# Patient Record
Sex: Female | Born: 2000 | Hispanic: Yes | Marital: Single | Smoking: Never smoker
Health system: Southern US, Community
[De-identification: ages and names within clinical notes are randomized; demographics above are authoritative.]

## PROBLEM LIST (undated history)

## (undated) DIAGNOSIS — E785 Hyperlipidemia, unspecified: Secondary | ICD-10-CM

## (undated) HISTORY — DX: Hyperlipidemia, unspecified: E78.5

## (undated) HISTORY — PX: POLYDACTYLY RECONSTRUCTION: SHX439

---
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
## 2021-10-09 ENCOUNTER — Ambulatory Visit (INDEPENDENT_AMBULATORY_CARE_PROVIDER_SITE_OTHER): Payer: Medicaid Other | Admitting: Student

## 2021-10-09 ENCOUNTER — Encounter: Payer: Self-pay | Admitting: Student

## 2021-10-09 ENCOUNTER — Other Ambulatory Visit (HOSPITAL_COMMUNITY)
Admission: RE | Admit: 2021-10-09 | Discharge: 2021-10-09 | Disposition: A | Discharge status: Home / Self Care | Payer: Medicaid Other | Source: Ambulatory Visit | Attending: Student | Admitting: Student

## 2021-10-09 VITALS — BP 111/71 | HR 68 | Ht 60.0 in | Wt 131.4 lb

## 2021-10-09 DIAGNOSIS — N898 Other specified noninflammatory disorders of vagina: Secondary | ICD-10-CM

## 2021-10-09 DIAGNOSIS — Z01419 Encounter for gynecological examination (general) (routine) without abnormal findings: Secondary | ICD-10-CM

## 2021-10-09 LAB — POCT URINE PREGNANCY: Preg Test, Ur: NEGATIVE

## 2021-10-09 NOTE — Progress Notes (Signed)
Annual  Stopped OCPs due to "hormone imbalance", mood swings. Sexually active, no contraception Reports reoccurring left side labial "cyst" Reports vaginal bleeding with sex on one occasion STI Testing requested.

## 2021-10-09 NOTE — Progress Notes (Signed)
  History:  Ms. Gabriela Jenkins is a 21 y.o. G0P0000 who presents to clinic today for annual.    For months- year painful cyst on left side.   Mood swings with OCP, stopped taking 6 months.   Clear discharge, less than a month ago.   The following portions of the patient's history were reviewed and updated as appropriate: allergies, current medications, family history, past medical history, social history, past surgical history and problem list.  Review of Systems:  ROS    Objective:  Physical Exam BP 111/71   Pulse 68   Ht 5' (1.524 m)   Wt 131 lb 6.4 oz (59.6 kg)   BMI 25.66 kg/m  Physical Exam    Labs and Imaging No results found for this or any previous visit (from the past 24 hour(s)).  No results found.   Assessment & Plan:  1. Women's annual routine gynecological examination ***    Approximately *** minutes of face-to-face time was spent with this patient   Corlis Hove, NP 10/09/2021 10:11 AM

## 2021-10-10 LAB — CERVICOVAGINAL ANCILLARY ONLY
Bacterial Vaginitis (gardnerella): NEGATIVE
Candida Glabrata: NEGATIVE
Candida Vaginitis: NEGATIVE
Chlamydia: NEGATIVE
Comment: NEGATIVE
Comment: NEGATIVE
Comment: NEGATIVE
Comment: NEGATIVE
Comment: NEGATIVE
Comment: NORMAL
Neisseria Gonorrhea: NEGATIVE
Trichomonas: NEGATIVE

## 2021-10-10 LAB — RPR: RPR Ser Ql: NONREACTIVE

## 2021-10-10 LAB — HIV ANTIBODY (ROUTINE TESTING W REFLEX): HIV Screen 4th Generation wRfx: NONREACTIVE

## 2021-10-10 LAB — HEPATITIS B SURFACE ANTIGEN: Hepatitis B Surface Ag: NEGATIVE

## 2021-10-10 LAB — HEPATITIS C ANTIBODY: Hep C Virus Ab: NONREACTIVE

## 2021-10-10 MED ORDER — LO LOESTRIN FE 1 MG-10 MCG / 10 MCG PO TABS: 1.0000 | ORAL_TABLET | Freq: Every day | ORAL | 3 refills | Status: DC

## 2021-11-26 ENCOUNTER — Ambulatory Visit (HOSPITAL_BASED_OUTPATIENT_CLINIC_OR_DEPARTMENT_OTHER): Payer: Medicaid Other | Admitting: Nurse Practitioner

## 2022-01-03 ENCOUNTER — Other Ambulatory Visit (HOSPITAL_BASED_OUTPATIENT_CLINIC_OR_DEPARTMENT_OTHER): Payer: Self-pay | Admitting: Nurse Practitioner

## 2022-01-03 ENCOUNTER — Ambulatory Visit (INDEPENDENT_AMBULATORY_CARE_PROVIDER_SITE_OTHER): Payer: Medicaid Other | Admitting: Nurse Practitioner

## 2022-01-03 ENCOUNTER — Encounter (HOSPITAL_BASED_OUTPATIENT_CLINIC_OR_DEPARTMENT_OTHER): Payer: Self-pay | Admitting: Nurse Practitioner

## 2022-01-03 VITALS — BP 114/72 | HR 76 | Ht 60.0 in | Wt 132.5 lb

## 2022-01-03 DIAGNOSIS — F339 Major depressive disorder, recurrent, unspecified: Secondary | ICD-10-CM | POA: Diagnosis not present

## 2022-01-03 DIAGNOSIS — Z0000 Encounter for general adult medical examination without abnormal findings: Secondary | ICD-10-CM

## 2022-01-03 DIAGNOSIS — R41840 Attention and concentration deficit: Secondary | ICD-10-CM

## 2022-01-03 DIAGNOSIS — G8929 Other chronic pain: Secondary | ICD-10-CM

## 2022-01-03 DIAGNOSIS — M545 Low back pain, unspecified: Secondary | ICD-10-CM

## 2022-01-03 DIAGNOSIS — F411 Generalized anxiety disorder: Secondary | ICD-10-CM

## 2022-01-03 MED ORDER — METAXALONE 800 MG PO TABS: ORAL_TABLET | ORAL | 3 refills | Status: DC

## 2022-01-03 MED ORDER — BUPROPION HCL ER (SR) 150 MG PO TB12: 150.0000 mg | ORAL_TABLET | Freq: Two times a day (BID) | ORAL | 1 refills | Status: DC

## 2022-01-03 NOTE — Progress Notes (Addendum)
Orma Render, DNP, AGNP-c Primary Care & Sports Medicine [address]  [place], [place] [address] [phone]  New patient visit   Patient: Gabriela Jenkins   DOB: 2000-07-14   21 y.o. Female  MRN: [id number] Visit Date: 01/03/2022  Patient Care Team: Orma Render, NP as PCP - General (Nurse Practitioner)  Today's Vitals   01/03/22 0933  BP: 114/72  Pulse: 76  SpO2: 100%  Weight: 132 lb 8 oz (60.1 kg)  Height: 5' (1.524 m)   Body mass index is 25.88 kg/m.   Today's healthcare provider: Orma Render, NP   Chief Complaint  Patient presents with   New Patient (Initial Visit)    Pt presents today to establish care.    Subjective    Gabriela Jenkins is a 21 y.o. female who presents today as a new patient to establish care.    Patient endorses the following concerns presently:  History of Pre-diabetes. She would like to have this tested today.   Depression She tells me that she has been struggling with depressive symptoms since July pretty severely, but initial onset was at 21 years old. She is seeing a therapist and they have discussed medication. She is interested inthis option.  She tells me that she is concerned that she may have ADHD. She tells me that her mind is spiraling and she is unable to sit still. She tells me that she procrastinates on puts off starting hard projects. She tells me that she has noticed a change in her grades. She has always been a straight A student, but her grades have faltered.   Hands swollen and very hard to move in the mornings. Back pain in the mornings.   Grandfather has RA.  History reviewed and reveals the following: Past Medical History:  Diagnosis Date   Hyperlipidemia    Past Surgical History:  Procedure Laterality Date   POLYDACTYLY RECONSTRUCTION     Family Status  Relation Name Status   Father  (Not Specified)   MGM  (Not Specified)   MGF  (Not Specified)   Mat Uncle  (Not  Specified)   Family History  Problem Relation Age of Onset   Asthma Father    Hypertension Maternal Grandmother    Hyperlipidemia Maternal Grandfather    Heart disease Maternal Uncle    Social History   Socioeconomic History   Marital status: Single    Spouse name: Not on file   Number of children: Not on file   Years of education: Not on file   Highest education level: 11th grade  Occupational History   Not on file  Tobacco Use   Smoking status: Never   Smokeless tobacco: Never  Vaping Use   Vaping Use: Never used  Substance and Sexual Activity   Alcohol use: Never   Drug use: Never   Sexual activity: Yes    Birth control/protection: None  Other Topics Concern   Not on file  Social History Narrative   Not on file   Social Determinants of Health   Financial Resource Strain: Not on file  Food Insecurity: Not on file  Transportation Needs: Not on file  Physical Activity: Not on file  Stress: Not on file  Social Connections: Not on file   Outpatient Medications Prior to Visit  Medication Sig   acetaminophen (TYLENOL) 325 MG tablet Take 650 mg by mouth every 6 (six) hours as needed for mild pain.   Calcium  Carbonate (CALCIUM 500 PO) Take by mouth. (Patient not taking: Reported on 09/03/2020)   LO LOESTRIN FE 1 MG-10 MCG / 10 MCG tablet Take 1 tablet by mouth daily. (Patient not taking: Reported on 01/03/2022)   Multiple Vitamin (MULTIVITAMIN) tablet Take 1 tablet by mouth daily. (Patient not taking: Reported on 10/09/2021)   VITAMIN D PO Take by mouth. (Patient not taking: Reported on 10/09/2021)   No facility-administered medications prior to visit.   Allergies  Allergen Reactions   Pollen Extract     There is no immunization history on file for this patient.  Review of Systems All review of systems negative except what is listed in the HPI   Objective    BP 114/72   Pulse 76   Ht 5' (1.524 m)   Wt 132 lb 8 oz (60.1 kg)   SpO2 100%   BMI 25.88 kg/m   Physical Exam Vitals and nursing note reviewed.  Constitutional:      General: She is not in acute distress.    Appearance: Normal appearance.  Eyes:     Extraocular Movements: Extraocular movements intact.     Conjunctiva/sclera: Conjunctivae normal.     Pupils: Pupils are equal, round, and reactive to light.  Neck:     Vascular: No carotid bruit.  Cardiovascular:     Rate and Rhythm: Normal rate and regular rhythm.     Pulses: Normal pulses.     Heart sounds: Normal heart sounds. No murmur heard. Pulmonary:     Effort: Pulmonary effort is normal.     Breath sounds: Normal breath sounds. No wheezing.  Abdominal:     General: Bowel sounds are normal.     Palpations: Abdomen is soft.  Musculoskeletal:        General: Normal range of motion.     Cervical back: Normal range of motion.     Right lower leg: No edema.     Left lower leg: No edema.  Skin:    General: Skin is warm and dry.     Capillary Refill: Capillary refill takes less than 2 seconds.  Neurological:     General: No focal deficit present.     Mental Status: She is alert and oriented to person, place, and time.  Psychiatric:        Mood and Affect: Mood normal.        Behavior: Behavior normal.        Thought Content: Thought content normal.        Judgment: Judgment normal.     Results for orders placed or performed in visit on 01/03/22  B12 and Folate Panel  Result Value Ref Range   Vitamin B-12 442 232 - 1,245 pg/mL   Folate 12.5 >3.0 ng/mL  CBC With Diff/Platelet  Result Value Ref Range   WBC 7.2 3.4 - 10.8 x10E3/uL   RBC 4.70 3.77 - 5.28 x10E6/uL   Hemoglobin 13.4 11.1 - 15.9 g/dL   Hematocrit 40.8 34.0 - 46.6 %   MCV 87 79 - 97 fL   MCH 28.5 26.6 - 33.0 pg   MCHC 32.8 31.5 - 35.7 g/dL   RDW 13.2 11.7 - 15.4 %   Platelets 248 150 - 450 x10E3/uL   Neutrophils 56 Not Estab. %   Lymphs 32 Not Estab. %   Monocytes 8 Not Estab. %   Eos 3 Not Estab. %   Basos 1 Not Estab. %   Neutrophils  Absolute 4.1 1.4 - 7.0 x10E3/uL  Lymphocytes Absolute 2.3 0.7 - 3.1 x10E3/uL   Monocytes Absolute 0.6 0.1 - 0.9 x10E3/uL   EOS (ABSOLUTE) 0.2 0.0 - 0.4 x10E3/uL   Basophils Absolute 0.1 0.0 - 0.2 x10E3/uL   Immature Granulocytes 0 Not Estab. %   Immature Grans (Abs) 0.0 0.0 - 0.1 x10E3/uL  Comprehensive metabolic panel  Result Value Ref Range   Glucose 94 70 - 99 mg/dL   BUN 14 6 - 20 mg/dL   Creatinine, Ser 0.69 0.57 - 1.00 mg/dL   eGFR 127 >59 mL/min/1.73   BUN/Creatinine Ratio 20 9 - 23   Sodium 139 134 - 144 mmol/L   Potassium 4.4 3.5 - 5.2 mmol/L   Chloride 103 96 - 106 mmol/L   CO2 22 20 - 29 mmol/L   Calcium 9.4 8.7 - 10.2 mg/dL   Total Protein 7.2 6.0 - 8.5 g/dL   Albumin 4.6 4.0 - 5.0 g/dL   Globulin, Total 2.6 1.5 - 4.5 g/dL   Albumin/Globulin Ratio 1.8 1.2 - 2.2   Bilirubin Total 0.3 0.0 - 1.2 mg/dL   Alkaline Phosphatase 74 42 - 106 IU/L   AST 19 0 - 40 IU/L   ALT 25 0 - 32 IU/L  Hemoglobin A1c  Result Value Ref Range   Hgb A1c MFr Bld 5.8 (H) 4.8 - 5.6 %   Est. average glucose Bld gHb Est-mCnc 120 mg/dL  VITAMIN D 25 Hydroxy (Vit-D Deficiency, Fractures)  Result Value Ref Range   Vit D, 25-Hydroxy 21.4 (L) 30.0 - 100.0 ng/mL  Thyroid Panel With TSH  Result Value Ref Range   TSH 1.890 0.450 - 4.500 uIU/mL   T4, Total 5.8 4.5 - 12.0 ug/dL   T3 Uptake Ratio 29 24 - 39 %   Free Thyroxine Index 1.7 1.2 - 4.9    Assessment & Plan      Problem List Items Addressed This Visit     Encounter for medical examination to establish care - Primary    Review of current and past medical history, social history, medication, and family history.  Review of care gaps and health maintenance recommendations.  Records from recent providers to be requested if not available in Chart Review or Care Everywhere.  Recommendations for health maintenance, diet, and exercise provided.        Relevant Orders   B12 and Folate Panel (Completed)   CBC With Diff/Platelet (Completed)    Comprehensive metabolic panel (Completed)   Hemoglobin A1c (Completed)   VITAMIN D 25 Hydroxy (Vit-D Deficiency, Fractures) (Completed)   Thyroid Panel With TSH (Completed)   Depression, recurrent (HCC)    Chronic.  At this time symptoms are worsening and recommendation of her therapist was to seek care for evaluation for medication.  Discussion on medication options with patient today for both treatment of depression symptoms and ADHD include recommendation for Wellbutrin.  Patient is interested in trialing this medication to see if this is helpful for her symptoms.  We will plan to start medication and monitor closely.  Plan to follow-up in 4 weeks or sooner if needed.      Relevant Medications   buPROPion (WELLBUTRIN SR) 150 MG 12 hr tablet   Other Relevant Orders   B12 and Folate Panel (Completed)   CBC With Diff/Platelet (Completed)   Comprehensive metabolic panel (Completed)   Hemoglobin A1c (Completed)   VITAMIN D 25 Hydroxy (Vit-D Deficiency, Fractures) (Completed)   Thyroid Panel With TSH (Completed)   Ambulatory referral to Psychiatry   Generalized anxiety  disorder   Relevant Medications   buPROPion (WELLBUTRIN SR) 150 MG 12 hr tablet   Other Relevant Orders   B12 and Folate Panel (Completed)   CBC With Diff/Platelet (Completed)   Comprehensive metabolic panel (Completed)   Hemoglobin A1c (Completed)   VITAMIN D 25 Hydroxy (Vit-D Deficiency, Fractures) (Completed)   Thyroid Panel With TSH (Completed)   Ambulatory referral to Psychiatry   Attention and concentration deficit    Concerns with attention deficit disorder.  She has not been diagnosed with this formally.  We will send referral to psychiatry for evaluation and diagnosis.  At this time we will plan to start Wellbutrin to see if this is helpful for symptom management for both ADHD and depression.      Relevant Medications   buPROPion (WELLBUTRIN SR) 150 MG 12 hr tablet   Other Relevant Orders   B12 and Folate  Panel (Completed)   CBC With Diff/Platelet (Completed)   Comprehensive metabolic panel (Completed)   Hemoglobin A1c (Completed)   VITAMIN D 25 Hydroxy (Vit-D Deficiency, Fractures) (Completed)   Thyroid Panel With TSH (Completed)   Ambulatory referral to Psychiatry   Chronic midline low back pain without sciatica    Chronic, intermittent.  We will send Skelaxin for as needed use for pain management.  Recommend gentle stretching exercises, ice, heat, and avoidance of limited activity as this can make chronic pain worse.      Relevant Medications   buPROPion (WELLBUTRIN SR) 150 MG 12 hr tablet   metaxalone (SKELAXIN) 800 MG tablet     Return in about 3 weeks (around 01/24/2022) for Phone call- mood.      Briston Lax, Coralee Pesa, NP, DNP, AGNP-C Primary Care & Sports Medicine at [place] Maintenance Due Health Maintenance Topics with due status: Overdue     Topic Date Due   HPV VACCINES Never done   TETANUS/TDAP Never done   INFLUENZA VACCINE Never done

## 2022-01-03 NOTE — Patient Instructions (Addendum)
Thank you for choosing [place] at [facility] for your Primary Care needs. I am excited for the opportunity to partner with you to meet your health care goals. It was a pleasure meeting you today!  Recommendations from today's visit: I have sent in wellbutrin for you to try for your depression, anxiety, and adhd symptoms. I have sent in a three month supply so that you can have enough to get you through for at least a little while. I will reach out to the pharmacy to see how much the medication is without insurance and we will work to find a way to help you afford I have put information on piriformis syndrome (butt pain with walking) on the back of this sheet for you to read over. It also has stretches for you.  I have included some stretches for your back- I want you to do these twice a day to see if this is helpful for your pain. I am going to see what the labs show to see if there is a cause that we can see for the pain.   Information on diet, exercise, and health maintenance recommendations are listed below. This is information to help you be sure you are on track for optimal health and monitoring.   Please look over this and let us know if you have any questions or if you have completed any of the health maintenance outside of [place] so that we can be sure your records are up to date.  ___________________________________________________________ About Me: I am an Adult-Geriatric Nurse Practitioner with a background in caring for patients for more than 20 years with a strong intensive care background. I provide primary care and sports medicine services to patients age 82 and older within this office. My education had a strong focus on caring for the older adult population, which I am passionate about. I am also the director of the APP Fellowship with [facility].   My desire is to provide you with the best service through preventive medicine and supportive care. I  consider you a part of the medical team and value your input. I work diligently to ensure that you are heard and your needs are met in a safe and effective manner. I want you to feel comfortable with me as your provider and want you to know that your health concerns are important to me.  For your information, our office hours are: Monday, Tuesday, and Thursday 8:00 AM - 5:00 PM Wednesday and Friday 8:00 AM - 12:00 PM.   In my time away from the office I am teaching new APP's within the system and am unavailable, but my partner, Dr. Burnard Bunting is in the office for emergent needs.   If you have questions or concerns, please call our office at [phone] or send [place] a MyChart message and we will respond as quickly as possible.  ____________________________________________________________ MyChart:  For all urgent or time sensitive needs we ask that you please call the office to avoid delays. Our number is [phone]. MyChart is not constantly monitored and due to the large volume of messages a day, replies may take up to 72 business hours.  MyChart Policy: MyChart allows for you to see your visit notes, after visit summary, provider recommendations, lab and tests results, make an appointment, request refills, and contact your provider or the office for non-urgent questions or concerns. Providers are seeing patients during normal business hours and do not have built in time to  review MyChart messages.  We ask that you allow a minimum of 3 business days for responses to [employer]. For this reason, please do not send urgent requests through [place]. Please call the office at [phone]. New and ongoing conditions may require a visit. We have virtual and in person visit available for your convenience.  Complex MyChart concerns may require a visit. Your provider may request you schedule a virtual or in person visit to ensure we are providing the best care possible. MyChart messages sent after  11:00 AM on Friday will not be received by the provider until Monday morning.    Lab and Test Results: You will receive your lab and test results on MyChart as soon as they are completed and results have been sent by the lab or testing facility. Due to this service, you will receive your results BEFORE your provider.  I review lab and tests results each morning prior to seeing patients. Some results require collaboration with other providers to ensure you are receiving the most appropriate care. For this reason, we ask that you please allow a minimum of 3-5 business days from the time the ALL results have been received for your provider to receive and review lab and test results and contact you about these.  Most lab and test result comments from the provider will be sent through [place]. Your provider may recommend changes to the plan of care, follow-up visits, repeat testing, ask questions, or request an office visit to discuss these results. You may reply directly to this message or call the office at [phone] to provide information for the provider or set up an appointment. In some instances, you will be called with test results and recommendations. Please let us know if this is preferred and we will make note of this in your chart to provide this for you.    If you have not heard a response to your lab or test results in 5 business days from all results returning to [place], please call the office to let us know. We ask that you please avoid calling prior to this time unless there is an emergent concern. Due to high call volumes, this can delay the resulting process.  After Hours: For all non-emergency after hours needs, please call the office at [phone] and select the option to reach the on-call provider service. On-call services are shared between multiple [place] offices and therefore it will not be possible to speak directly with your provider. On-call providers may provide medical  advice and recommendations, but are unable to provide refills for maintenance medications.  For all emergency or urgent medical needs after normal business hours, we recommend that you seek care at the closest Urgent Care or Emergency Department to ensure appropriate treatment in a timely manner.  [facility] has a 24 hour emergency room located on the ground floor for your convenience.   Urgent Concerns During the Business Day Providers are seeing patients from 8AM to [place] with a busy schedule and are most often not able to respond to non-urgent calls until the end of the day or the next business day. If you should have URGENT concerns during the day, please call and speak to the nurse or schedule a same day appointment so that we can address your concern without delay.   Thank you, again, for choosing me as your health care partner. I appreciate your trust and look forward to learning more about you.   SaraBeth Early,  DNP, AGNP-c ___________________________________________________________  Health Maintenance Recommendations Screening Testing Mammogram Every 1 -2 years based on history and risk factors Starting at age 28 Pap Smear Ages 21-39 every 3 years Ages 28-65 every 5 years with HPV testing More frequent testing may be required based on results and history Colon Cancer Screening Every 1-10 years based on test performed, risk factors, and history Starting at age 35 Bone Density Screening Every 2-10 years based on history Starting at age 22 for women Recommendations for men differ based on medication usage, history, and risk factors AAA Screening One time ultrasound Men 11-8 years old who have every smoked Lung Cancer Screening Low Dose Lung CT every 12 months Age 40-80 years with a 30 pack-year smoking history who still smoke or who have quit within the last 15 years  Screening Labs Routine  Labs: Complete Blood Count (CBC), Complete Metabolic Panel  (CMP), Cholesterol (Lipid Panel) Every 6-12 months based on history and medications May be recommended more frequently based on current conditions or previous results Hemoglobin A1c Lab Every 3-12 months based on history and previous results Starting at age 31 or earlier with diagnosis of diabetes, high cholesterol, BMI >26, and/or risk factors Frequent monitoring for patients with diabetes to ensure blood sugar control Thyroid Panel (TSH w/ T3 & T4) Every 6 months based on history, symptoms, and risk factors May be repeated more often if on medication HIV One time testing for all patients 73 and older May be repeated more frequently for patients with increased risk factors or exposure Hepatitis C One time testing for all patients 79 and older May be repeated more frequently for patients with increased risk factors or exposure Gonorrhea, Chlamydia Every 12 months for all sexually active persons 13-24 years Additional monitoring may be recommended for those who are considered high risk or who have symptoms PSA Men 48-49 years old with risk factors Additional screening may be recommended from age 85-69 based on risk factors, symptoms, and history  Vaccine Recommendations Tetanus Booster All adults every 10 years Flu Vaccine All patients 6 months and older every year COVID Vaccine All patients 12 years and older Initial dosing with booster May recommend additional booster based on age and health history HPV Vaccine 2 doses all patients age 73-26 Dosing may be considered for patients over 26 Shingles Vaccine (Shingrix) 2 doses all adults 81 years and older Pneumonia (Pneumovax 23) All adults 22 years and older May recommend earlier dosing based on health history Pneumonia (Prevnar 79) All adults 38 years and older Dosed 1 year after Pneumovax 23  Additional Screening, Testing, and Vaccinations may be recommended on an individualized basis based on family history, health  history, risk factors, and/or exposure.  __________________________________________________________  Diet Recommendations for All Patients  I recommend that all patients maintain a diet low in saturated fats, carbohydrates, and cholesterol. While this can be challenging at first, it is not impossible and small changes can make big differences.  Things to try: Decreasing the amount of soda, sweet tea, and/or juice to one or less per day and replace with water While water is always the first choice, if you do not like water you may consider adding a water additive without sugar to improve the taste other sugar free drinks Replace potatoes with a brightly colored vegetable at dinner Use healthy oils, such as canola oil or olive oil, instead of butter or hard margarine Limit your bread intake to two pieces or less a day Replace regular pasta with  low carb pasta options Bake, broil, or grill foods instead of frying Monitor portion sizes  Eat smaller, more frequent meals throughout the day instead of large meals  An important thing to remember is, if you love foods that are not great for your health, you don't have to give them up completely. Instead, allow these foods to be a reward when you have done well. Allowing yourself to still have special treats every once in a while is a nice way to tell yourself thank you for working hard to keep yourself healthy.   Also remember that every day is a new day. If you have a bad day and "fall off the wagon", you can still climb right back up and keep moving along on your journey!  We have resources available to help you!  Some websites that may be helpful include: www.http://carter.biz/  Www.VeryWellFit.com _____________________________________________________________  Activity Recommendations for All Patients  I recommend that all adults get at least 20 minutes of moderate physical activity that elevates your heart rate at least 5 days out of the week.   Some examples include: Walking or jogging at a pace that allows you to carry on a conversation Cycling (stationary bike or outdoors) Water aerobics Yoga Weight lifting Dancing If physical limitations prevent you from putting stress on your joints, exercise in a pool or seated in a chair are excellent options.  Do determine your MAXIMUM heart rate for activity: YOUR AGE - 220 = MAX HeartRate   Remember! Do not push yourself too hard.  Start slowly and build up your pace, speed, weight, time in exercise, etc.  Allow your body to rest between exercise and get good sleep. You will need more water than normal when you are exerting yourself. Do not wait until you are thirsty to drink. Drink with a purpose of getting in at least 8, 8 ounce glasses of water a day plus more depending on how much you exercise and sweat.    If you begin to develop dizziness, chest pain, abdominal pain, jaw pain, shortness of breath, headache, vision changes, lightheadedness, or other concerning symptoms, stop the activity and allow your body to rest. If your symptoms are severe, seek emergency evaluation immediately. If your symptoms are concerning, but not severe, please let us know so that we can recommend further evaluation.

## 2022-01-04 LAB — COMPREHENSIVE METABOLIC PANEL
ALT: 25 IU/L (ref 0–32)
AST: 19 IU/L (ref 0–40)
Albumin/Globulin Ratio: 1.8 (ref 1.2–2.2)
Albumin: 4.6 g/dL (ref 4.0–5.0)
Alkaline Phosphatase: 74 IU/L (ref 42–106)
BUN/Creatinine Ratio: 20 (ref 9–23)
BUN: 14 mg/dL (ref 6–20)
Bilirubin Total: 0.3 mg/dL (ref 0.0–1.2)
CO2: 22 mmol/L (ref 20–29)
Calcium: 9.4 mg/dL (ref 8.7–10.2)
Chloride: 103 mmol/L (ref 96–106)
Creatinine, Ser: 0.69 mg/dL (ref 0.57–1.00)
Globulin, Total: 2.6 g/dL (ref 1.5–4.5)
Glucose: 94 mg/dL (ref 70–99)
Potassium: 4.4 mmol/L (ref 3.5–5.2)
Sodium: 139 mmol/L (ref 134–144)
Total Protein: 7.2 g/dL (ref 6.0–8.5)
eGFR: 127 mL/min/{1.73_m2} (ref >59)

## 2022-01-04 LAB — CBC WITH DIFF/PLATELET
Basophils Absolute: 0.1 10*3/uL (ref 0.0–0.2)
Basos: 1 %
EOS (ABSOLUTE): 0.2 10*3/uL (ref 0.0–0.4)
Eos: 3 %
Hematocrit: 40.8 % (ref 34.0–46.6)
Hemoglobin: 13.4 g/dL (ref 11.1–15.9)
Immature Grans (Abs): 0 10*3/uL (ref 0.0–0.1)
Immature Granulocytes: 0 %
Lymphocytes Absolute: 2.3 10*3/uL (ref 0.7–3.1)
Lymphs: 32 %
MCH: 28.5 pg (ref 26.6–33.0)
MCHC: 32.8 g/dL (ref 31.5–35.7)
MCV: 87 fL (ref 79–97)
Monocytes Absolute: 0.6 10*3/uL (ref 0.1–0.9)
Monocytes: 8 %
Neutrophils Absolute: 4.1 10*3/uL (ref 1.4–7.0)
Neutrophils: 56 %
Platelets: 248 10*3/uL (ref 150–450)
RBC: 4.7 x10E6/uL (ref 3.77–5.28)
RDW: 13.2 % (ref 11.7–15.4)
WBC: 7.2 10*3/uL (ref 3.4–10.8)

## 2022-01-04 LAB — B12 AND FOLATE PANEL
Folate: 12.5 ng/mL (ref >3.0)
Vitamin B-12: 442 pg/mL (ref 232–1245)

## 2022-01-04 LAB — HEMOGLOBIN A1C
Est. average glucose Bld gHb Est-mCnc: 120 mg/dL
Hgb A1c MFr Bld: 5.8 % — ABNORMAL HIGH (ref 4.8–5.6)

## 2022-01-04 LAB — THYROID PANEL WITH TSH
Free Thyroxine Index: 1.7 (ref 1.2–4.9)
T3 Uptake Ratio: 29 % (ref 24–39)
T4, Total: 5.8 ug/dL (ref 4.5–12.0)
TSH: 1.89 u[IU]/mL (ref 0.450–4.500)

## 2022-01-04 LAB — VITAMIN D 25 HYDROXY (VIT D DEFICIENCY, FRACTURES): Vit D, 25-Hydroxy: 21.4 ng/mL — ABNORMAL LOW (ref 30.0–100.0)

## 2022-01-07 DIAGNOSIS — R41840 Attention and concentration deficit: Secondary | ICD-10-CM | POA: Insufficient documentation

## 2022-01-07 DIAGNOSIS — Z0000 Encounter for general adult medical examination without abnormal findings: Secondary | ICD-10-CM | POA: Insufficient documentation

## 2022-01-07 DIAGNOSIS — F339 Major depressive disorder, recurrent, unspecified: Secondary | ICD-10-CM | POA: Insufficient documentation

## 2022-01-07 DIAGNOSIS — G8929 Other chronic pain: Secondary | ICD-10-CM | POA: Insufficient documentation

## 2022-01-07 DIAGNOSIS — F411 Generalized anxiety disorder: Secondary | ICD-10-CM | POA: Insufficient documentation

## 2022-01-07 NOTE — Assessment & Plan Note (Signed)
Chronic.  At this time symptoms are worsening and recommendation of her therapist was to seek care for evaluation for medication.  Discussion on medication options with patient today for both treatment of depression symptoms and ADHD include recommendation for Wellbutrin.  Patient is interested in trialing this medication to see if this is helpful for her symptoms.  We will plan to start medication and monitor closely.  Plan to follow-up in 4 weeks or sooner if needed.

## 2022-01-07 NOTE — Assessment & Plan Note (Signed)
Review of current and past medical history, social history, medication, and family history.  Review of care gaps and health maintenance recommendations.  Records from recent providers to be requested if not available in Chart Review or Care Everywhere.  Recommendations for health maintenance, diet, and exercise provided.   

## 2022-01-07 NOTE — Assessment & Plan Note (Signed)
Chronic, intermittent.  We will send Skelaxin for as needed use for pain management.  Recommend gentle stretching exercises, ice, heat, and avoidance of limited activity as this can make chronic pain worse.

## 2022-01-07 NOTE — Assessment & Plan Note (Signed)
Concerns with attention deficit disorder.  She has not been diagnosed with this formally.  We will send referral to psychiatry for evaluation and diagnosis.  At this time we will plan to start Wellbutrin to see if this is helpful for symptom management for both ADHD and depression.

## 2022-01-08 ENCOUNTER — Other Ambulatory Visit (HOSPITAL_BASED_OUTPATIENT_CLINIC_OR_DEPARTMENT_OTHER): Payer: Self-pay | Admitting: Nurse Practitioner

## 2022-01-24 ENCOUNTER — Telehealth (HOSPITAL_BASED_OUTPATIENT_CLINIC_OR_DEPARTMENT_OTHER): Payer: Self-pay | Admitting: *Deleted

## 2022-01-24 ENCOUNTER — Telehealth (HOSPITAL_BASED_OUTPATIENT_CLINIC_OR_DEPARTMENT_OTHER): Payer: Medicaid Other | Admitting: Nurse Practitioner

## 2022-01-24 NOTE — Telephone Encounter (Signed)
Attempted to call pt again after seeing that she read the MyChart message and phone went straight to VM.Gabriela Jenkins, CMA

## 2022-01-24 NOTE — Telephone Encounter (Signed)
LVM for pt to call office to go over her pre-visit questions prior to her phone visit.Gabriela Jenkins, CMA

## 2022-01-28 ENCOUNTER — Ambulatory Visit (INDEPENDENT_AMBULATORY_CARE_PROVIDER_SITE_OTHER): Payer: Medicaid Other | Admitting: Family Medicine

## 2022-01-28 ENCOUNTER — Encounter (HOSPITAL_BASED_OUTPATIENT_CLINIC_OR_DEPARTMENT_OTHER): Payer: Self-pay | Admitting: Family Medicine

## 2022-01-28 VITALS — BP 115/73 | HR 72 | Ht 60.0 in | Wt 131.4 lb

## 2022-01-28 DIAGNOSIS — F339 Major depressive disorder, recurrent, unspecified: Secondary | ICD-10-CM

## 2022-01-28 DIAGNOSIS — R41840 Attention and concentration deficit: Secondary | ICD-10-CM | POA: Diagnosis not present

## 2022-01-28 DIAGNOSIS — M654 Radial styloid tenosynovitis [de Quervain]: Secondary | ICD-10-CM

## 2022-01-28 DIAGNOSIS — F411 Generalized anxiety disorder: Secondary | ICD-10-CM

## 2022-01-28 DIAGNOSIS — E559 Vitamin D deficiency, unspecified: Secondary | ICD-10-CM

## 2022-01-28 MED ORDER — VITAMIN D3 25 MCG (1000 UT) PO CAPS: 1000.0000 [IU] | ORAL_CAPSULE | Freq: Every day | ORAL | 1 refills | Status: DC

## 2022-01-28 NOTE — Assessment & Plan Note (Signed)
As above, patient has been utilizing Wellbutrin with some improvement in his symptoms.  Was referred previously to psychiatry, however it appears that this referral has been closed. We have placed a new referral to psychiatry today for further evaluation and recommendations After discussion, patient elected to proceed with continued dose of Wellbutrin, no changes made to medication today

## 2022-01-28 NOTE — Assessment & Plan Note (Signed)
At prior visit with PCP, she was started on Wellbutrin twice daily and reports that she has been doing well with this.  She did note initial improvement in symptoms when for starting the medication, however feels that it has not been quite as helpful over the last week or 2.  She denies any notable side effects that she has been having with the medication.  She also continues working with counselor.  She was referred to psychiatry previously, however never did hear from specialist office. Reviewed chart, it does appear that referral to psychiatry was placed, however the referred to provider is no longer at that location and thus referral was closed. New referral to psychiatry placed today.  We discussed options related to medication management and specifically discussed consideration for titration of medication.  At this time, patient would like to continue with the same dose of medication which is reasonable and we will plan for continued monitoring related to medication management

## 2022-01-28 NOTE — Progress Notes (Signed)
Procedures performed today:    None.  Independent interpretation of notes and tests performed by another provider:   None.  Brief History, Exam, Impression, and Recommendations:    BP 115/73 (BP Location: Right Arm, Patient Position: Sitting, Cuff Size: Normal)   Pulse 72   Ht 5' (1.524 m)   Wt 131 lb 6.4 oz (59.6 kg)   SpO2 100%   BMI 25.66 kg/m   De Quervain's tenosynovitis, right Patient reports that recently, she began to have pain near the base of her thumb at wrist level.  She recalls writing at the time when she had sudden sharp pain in this area.  She denies any prior injuries to right thumb or wrist.  She has not had any associated numbness or tingling.  She is right-handed.  No obvious swelling, no weakness. On exam, patient is in no acute distress.  There are no overlying skin changes at radial aspect of wrist or long thumb.  Normal active and passive wrist flexion, extension, radial and ulnar deviation.  She does have some tenderness to palpation along first dorsal compartment.  Positive Finkelstein test. History and exam are most consistent with de Quervain's tenosynovitis.  Discussed diagnosis with patient as well as recommended initial treatment including topical Voltaren gel, use of thumb spica splint, activity modification.  Discussed that some considerations can be for evaluation and treatment with occupational therapy, ultrasound-guided tendon sheath injection.  We will typically proceed with conservative measures initially and then reassess if further intervention is needed  Depression, recurrent ([place]) At prior visit with PCP, she was started on Wellbutrin twice daily and reports that she has been doing well with this.  She did note initial improvement in symptoms when for starting the medication, however feels that it has not been quite as helpful over the last week or 2.  She denies any notable side effects that she has been having with the medication.  She also  continues working with counselor.  She was referred to psychiatry previously, however never did hear from specialist office. Reviewed chart, it does appear that referral to psychiatry was placed, however the referred to provider is no longer at that location and thus referral was closed. New referral to psychiatry placed today.  We discussed options related to medication management and specifically discussed consideration for titration of medication.  At this time, patient would like to continue with the same dose of medication which is reasonable and we will plan for continued monitoring related to medication management  Attention and concentration deficit As above, patient has been utilizing Wellbutrin with some improvement in his symptoms.  Was referred previously to psychiatry, however it appears that this referral has been closed. We have placed a new referral to psychiatry today for further evaluation and recommendations After discussion, patient elected to proceed with continued dose of Wellbutrin, no changes made to medication today  Vitamin D deficiency Noted on prior labs in the past, today she is requesting prescription for vitamin D supplement for treatment of underlying vitamin D deficiency.  Prescription sent to pharmacy on file, recommend comparing price of prescription versus over-the-counter formulation as some insurances may not cover prescription medication quite as well.  We will plan to recheck vitamin D level in the future after having been taking vitamin D supplement regularly for at least 2 to 3 months  Return in about 6 weeks (around 03/11/2022) for med check, right wrist.   ___________________________________________ Athanasius Kesling de [place], MD, ABFM, CAQSM Primary Care  and Sports Medicine [facility] [place]

## 2022-01-28 NOTE — Assessment & Plan Note (Signed)
Noted on prior labs in the past, today she is requesting prescription for vitamin D supplement for treatment of underlying vitamin D deficiency.  Prescription sent to pharmacy on file, recommend comparing price of prescription versus over-the-counter formulation as some insurances may not cover prescription medication quite as well.  We will plan to recheck vitamin D level in the future after having been taking vitamin D supplement regularly for at least 2 to 3 months

## 2022-01-28 NOTE — Patient Instructions (Signed)
  Medication Instructions:  Your physician recommends that you continue on your current medications as directed. Please refer to the Current Medication list given to you today. --If you need a refill on any your medications before your next appointment, please call your pharmacy first. If no refills are authorized on file call the office.-- Lab Work: Your physician has recommended that you have lab work today: No If you have labs (blood work) drawn today and your tests are completely normal, you will receive your results via MyChart message OR a phone call from our staff.  Please ensure you check your voicemail in the event that you authorized detailed messages to be left on a delegated number. If you have any lab test that is abnormal or we need to change your treatment, we will call you to review the results.  Referrals/Procedures/Imaging: Yes  Follow-Up: Your next appointment:   Your physician recommends that you schedule a follow-up appointment in: 4-6 weeks with Dr. de [place]  You will receive a text message or e-mail with a link to a survey about your care and experience with us today! We would greatly appreciate your feedback!   Thanks for letting us be apart of your health journey!!  Primary Care and Sports Medicine   Dr. Raymond de [place]   We encourage you to activate your patient portal called "MyChart".  Sign up information is provided on this After Visit Summary.  MyChart is used to connect with patients for Virtual Visits (Telemedicine).  Patients are able to view lab/test results, encounter notes, upcoming appointments, etc.  Non-urgent messages can be sent to your provider as well. To learn more about what you can do with MyChart, please visit --  [email or web address].    

## 2022-01-28 NOTE — Assessment & Plan Note (Signed)
Patient reports that recently, she began to have pain near the base of her thumb at wrist level.  She recalls writing at the time when she had sudden sharp pain in this area.  She denies any prior injuries to right thumb or wrist.  She has not had any associated numbness or tingling.  She is right-handed.  No obvious swelling, no weakness. On exam, patient is in no acute distress.  There are no overlying skin changes at radial aspect of wrist or long thumb.  Normal active and passive wrist flexion, extension, radial and ulnar deviation.  She does have some tenderness to palpation along first dorsal compartment.  Positive Finkelstein test. History and exam are most consistent with de Quervain's tenosynovitis.  Discussed diagnosis with patient as well as recommended initial treatment including topical Voltaren gel, use of thumb spica splint, activity modification.  Discussed that some considerations can be for evaluation and treatment with occupational therapy, ultrasound-guided tendon sheath injection.  We will typically proceed with conservative measures initially and then reassess if further intervention is needed

## 2022-03-11 ENCOUNTER — Encounter (HOSPITAL_BASED_OUTPATIENT_CLINIC_OR_DEPARTMENT_OTHER): Payer: Self-pay | Admitting: Family Medicine

## 2022-03-11 ENCOUNTER — Ambulatory Visit (INDEPENDENT_AMBULATORY_CARE_PROVIDER_SITE_OTHER): Payer: Medicaid Other | Admitting: Family Medicine

## 2022-03-11 DIAGNOSIS — M654 Radial styloid tenosynovitis [de Quervain]: Secondary | ICD-10-CM | POA: Diagnosis not present

## 2022-03-11 DIAGNOSIS — F339 Major depressive disorder, recurrent, unspecified: Secondary | ICD-10-CM | POA: Diagnosis not present

## 2022-03-11 DIAGNOSIS — R41840 Attention and concentration deficit: Secondary | ICD-10-CM | POA: Diagnosis not present

## 2022-03-11 MED ORDER — BUPROPION HCL ER (XL) 150 MG PO TB24: 150.0000 mg | ORAL_TABLET | Freq: Every day | ORAL | 1 refills | Status: DC

## 2022-03-11 NOTE — Assessment & Plan Note (Signed)
She decays that she was having difficulty with remembering to take second dose of medication later in the day.  She was being prescribed Wellbutrin twice daily.  She continues to work with counseling.  Due to difficulty with remembering to take medication, she stopped taking medication as she felt that she was having some side effects with forgetting second dose during the day.  She did discuss this with a counselor as well who suggested patient ask about once daily option. We discussed options today and we will proceed with once daily Wellbutrin 150 mg dose.  Again reviewed potential risks and side effects.  We will plan for close follow-up in about 2 weeks to monitor progress medication and determine medication response and possible titration to 300 mg dose She was previously referred to psychiatry, had to be resent to alternative office, have scheduled this upcoming

## 2022-03-11 NOTE — Progress Notes (Signed)
    Procedures performed today:    None.  Independent interpretation of notes and tests performed by another provider:   None.  Brief History, Exam, Impression, and Recommendations:    BP 111/72 (BP Location: Left Arm, Patient Position: Sitting, Cuff Size: Normal)   Pulse 81   Ht 5' (1.524 m)   Wt 133 lb 4.8 oz (60.5 kg)   SpO2 100%   BMI 26.03 kg/m   De Quervain's tenosynovitis, right Since last appointment, this has been improving for patient.  Has not been as impactful related to day-to-day activities.  Recommend continuing with conservative measures and monitoring at this time.  If symptoms worsen in the future, consider thumb spica splint, ultrasound-guided injection  Depression, recurrent ([place]) She decays that she was having difficulty with remembering to take second dose of medication later in the day.  She was being prescribed Wellbutrin twice daily.  She continues to work with counseling.  Due to difficulty with remembering to take medication, she stopped taking medication as she felt that she was having some side effects with forgetting second dose during the day.  She did discuss this with a counselor as well who suggested patient ask about once daily option. We discussed options today and we will proceed with once daily Wellbutrin 150 mg dose.  Again reviewed potential risks and side effects.  We will plan for close follow-up in about 2 weeks to monitor progress medication and determine medication response and possible titration to 300 mg dose She was previously referred to psychiatry, had to be resent to alternative office, have scheduled this upcoming  Attention and concentration deficit Management as per above.  Utilizing Wellbutrin and attempts to improve symptoms related to both underlying depression and attention deficit.  Medication management as outlined above.  Return in about 2 weeks (around 03/25/2022) for med check, can be virtual if  desired.   ___________________________________________ Dak Szumski [place], MD, ABFM, CAQSM Primary Care and [place]

## 2022-03-11 NOTE — Assessment & Plan Note (Signed)
Since last appointment, this has been improving for patient.  Has not been as impactful related to day-to-day activities.  Recommend continuing with conservative measures and monitoring at this time.  If symptoms worsen in the future, consider thumb spica splint, ultrasound-guided injection

## 2022-03-11 NOTE — Assessment & Plan Note (Signed)
Management as per above.  Utilizing Wellbutrin and attempts to improve symptoms related to both underlying depression and attention deficit.  Medication management as outlined above.

## 2022-03-31 ENCOUNTER — Ambulatory Visit (HOSPITAL_COMMUNITY): Payer: Medicaid Other | Admitting: Clinical

## 2022-04-02 ENCOUNTER — Telehealth (HOSPITAL_BASED_OUTPATIENT_CLINIC_OR_DEPARTMENT_OTHER): Payer: Medicaid Other | Admitting: Family Medicine

## 2022-04-04 ENCOUNTER — Ambulatory Visit (HOSPITAL_COMMUNITY): Payer: Medicaid Other | Admitting: Psychiatry

## 2022-04-05 ENCOUNTER — Emergency Department (HOSPITAL_COMMUNITY)
Admission: EM | Admit: 2022-04-05 | Discharge: 2022-04-06 | Disposition: A | Discharge status: Home / Self Care | Payer: Medicaid Other | Attending: Emergency Medicine | Admitting: Emergency Medicine

## 2022-04-05 ENCOUNTER — Other Ambulatory Visit: Payer: Self-pay

## 2022-04-05 ENCOUNTER — Encounter (HOSPITAL_COMMUNITY): Payer: Self-pay

## 2022-04-05 DIAGNOSIS — Z1152 Encounter for screening for COVID-19: Secondary | ICD-10-CM | POA: Insufficient documentation

## 2022-04-05 DIAGNOSIS — F411 Generalized anxiety disorder: Secondary | ICD-10-CM | POA: Diagnosis present

## 2022-04-05 DIAGNOSIS — T50902A Poisoning by unspecified drugs, medicaments and biological substances, intentional self-harm, initial encounter: Secondary | ICD-10-CM

## 2022-04-05 DIAGNOSIS — R45851 Suicidal ideations: Secondary | ICD-10-CM | POA: Insufficient documentation

## 2022-04-05 DIAGNOSIS — T50901A Poisoning by unspecified drugs, medicaments and biological substances, accidental (unintentional), initial encounter: Secondary | ICD-10-CM | POA: Diagnosis present

## 2022-04-05 DIAGNOSIS — T39392A Poisoning by other nonsteroidal anti-inflammatory drugs [NSAID], intentional self-harm, initial encounter: Secondary | ICD-10-CM | POA: Diagnosis present

## 2022-04-05 DIAGNOSIS — T39312A Poisoning by propionic acid derivatives, intentional self-harm, initial encounter: Secondary | ICD-10-CM

## 2022-04-05 DIAGNOSIS — F410 Panic disorder [episodic paroxysmal anxiety] without agoraphobia: Secondary | ICD-10-CM | POA: Diagnosis present

## 2022-04-05 LAB — RAPID URINE DRUG SCREEN, HOSP PERFORMED
Amphetamines: NOT DETECTED
Barbiturates: NOT DETECTED
Benzodiazepines: NOT DETECTED
Cocaine: NOT DETECTED
Opiates: NOT DETECTED
Tetrahydrocannabinol: NOT DETECTED

## 2022-04-05 LAB — COMPREHENSIVE METABOLIC PANEL
ALT: 21 U/L (ref 0–44)
AST: 22 U/L (ref 15–41)
Albumin: 4 g/dL (ref 3.5–5.0)
Alkaline Phosphatase: 54 U/L (ref 38–126)
Anion gap: 7 (ref 5–15)
BUN: 16 mg/dL (ref 6–20)
CO2: 19 mmol/L — ABNORMAL LOW (ref 22–32)
Calcium: 8 mg/dL — ABNORMAL LOW (ref 8.9–10.3)
Chloride: 111 mmol/L (ref 98–111)
Creatinine, Ser: 0.81 mg/dL (ref 0.44–1.00)
GFR, Estimated: >60 mL/min (ref >60)
Glucose, Bld: 100 mg/dL — ABNORMAL HIGH (ref 70–99)
Potassium: 3.8 mmol/L (ref 3.5–5.1)
Sodium: 137 mmol/L (ref 135–145)
Total Bilirubin: 0.2 mg/dL — ABNORMAL LOW (ref 0.3–1.2)
Total Protein: 7.1 g/dL (ref 6.5–8.1)

## 2022-04-05 LAB — CBC WITH DIFFERENTIAL/PLATELET
Abs Immature Granulocytes: 0.02 10*3/uL (ref 0.00–0.07)
Basophils Absolute: 0.1 10*3/uL (ref 0.0–0.1)
Basophils Relative: 1 %
Eosinophils Absolute: 0.2 10*3/uL (ref 0.0–0.5)
Eosinophils Relative: 3 %
HCT: 38.1 % (ref 36.0–46.0)
Hemoglobin: 12.2 g/dL (ref 12.0–15.0)
Immature Granulocytes: 0 %
Lymphocytes Relative: 38 %
Lymphs Abs: 3.7 10*3/uL (ref 0.7–4.0)
MCH: 28.5 pg (ref 26.0–34.0)
MCHC: 32 g/dL (ref 30.0–36.0)
MCV: 89 fL (ref 80.0–100.0)
Monocytes Absolute: 0.7 10*3/uL (ref 0.1–1.0)
Monocytes Relative: 7 %
Neutro Abs: 5 10*3/uL (ref 1.7–7.7)
Neutrophils Relative %: 51 %
Platelets: 273 10*3/uL (ref 150–400)
RBC: 4.28 MIL/uL (ref 3.87–5.11)
RDW: 13.3 % (ref 11.5–15.5)
WBC: 9.8 10*3/uL (ref 4.0–10.5)
nRBC: 0 % (ref 0.0–0.2)

## 2022-04-05 LAB — ACETAMINOPHEN LEVEL: Acetaminophen (Tylenol), Serum: <10 ug/mL — ABNORMAL LOW (ref 10–30)

## 2022-04-05 LAB — I-STAT BETA HCG BLOOD, ED (MC, WL, AP ONLY): I-stat hCG, quantitative: <5 m[IU]/mL (ref <5)

## 2022-04-05 LAB — RESP PANEL BY RT-PCR (RSV, FLU A&B, COVID)  RVPGX2
Influenza A by PCR: NEGATIVE
Influenza B by PCR: NEGATIVE
Resp Syncytial Virus by PCR: NEGATIVE
SARS Coronavirus 2 by RT PCR: NEGATIVE

## 2022-04-05 LAB — ETHANOL: Alcohol, Ethyl (B): <10 mg/dL (ref <10)

## 2022-04-05 LAB — SALICYLATE LEVEL: Salicylate Lvl: <7 mg/dL — ABNORMAL LOW (ref 7.0–30.0)

## 2022-04-05 MED ORDER — CHARCOAL ACTIVATED PO LIQD
50.0000 g | Freq: Once | ORAL | Status: AC
  Administered 2022-04-05: 50 g via ORAL
  Filled 2022-04-05: qty 240

## 2022-04-05 MED ORDER — SODIUM CHLORIDE 0.9 % IV BOLUS
1000.0000 mL | Freq: Once | INTRAVENOUS | Status: AC
  Administered 2022-04-05: 1000 mL via INTRAVENOUS

## 2022-04-05 NOTE — ED Triage Notes (Addendum)
BIBA from home took 12600mg  Ibuprofen in a suicide attempt an hour ago, has nausea, vss, 800mg  NS in a 18 LAC, Poison Control called by EMS and recommends charcoal

## 2022-04-05 NOTE — ED Provider Notes (Signed)
[facility] Provider Note   CSN: [id number] Arrival date & time: 04/05/22  2120     History  Chief Complaint  Patient presents with   Ingestion    Gabriela Jenkins is a 22 y.o. female.  Patient here after suicide attempt.  She has been struggling with depression and anxiety.  She has been stressed out over school and work and family.  She took according to EMS 12,600 mg ibuprofen at around 7:45 PM.  She denies any other coingestions including alcohol or drugs.  Patient called her cousin after this occurred who then called EMS.  EMS talked with poison control and they recommended charcoal.  She denies any abdominal pain.  She had some nausea but that has resolved.  No chest pain or shortness of breath or vomiting.  The history is provided by the patient.       Home Medications Prior to Admission medications   Medication Sig Start Date End Date Taking? Authorizing Provider  acetaminophen (TYLENOL) 325 MG tablet Take 650 mg by mouth every 6 (six) hours as needed for mild pain.    [provider]  buPROPion (WELLBUTRIN XL) 150 MG 24 hr tablet Take 1 tablet (150 mg total) by mouth daily. 03/11/22   de [place], Raymond J, MD  Calcium Carbonate (CALCIUM 500 PO) Take by mouth. Patient not taking: Reported on 09/03/2020    [provider]  Cholecalciferol (VITAMIN D3) 25 MCG (1000 UT) CAPS Take 1 capsule (1,000 Units total) by mouth daily. Patient not taking: Reported on 03/11/2022 01/28/22   [place], Blondell Reveal, MD  ibuprofen (ADVIL) 600 MG tablet Take 600 mg by mouth 3 (three) times daily as needed. 02/10/22   [provider]  LO LOESTRIN FE 1 MG-10 MCG / 10 MCG tablet Take 1 tablet by mouth daily. Patient not taking: Reported on 01/03/2022 10/10/21   Johnston Ebbs, NP  Multiple Vitamin (MULTIVITAMIN) tablet Take 1 tablet by mouth daily. Patient not taking: Reported on 10/09/2021    [provider]  tiZANidine  (ZANAFLEX) 4 MG tablet Take one tablet by mouth every 12 hours as needed for muscle pain and spasms. May make you sleepy- use caution until you know how medication will affect you. Patient not taking: Reported on 01/28/2022 01/08/22   Orma Render, NP      Allergies    Pollen extract    Review of Systems   Review of Systems  Physical Exam Updated Vital Signs BP 119/89 (BP Location: Right Arm)   Pulse 81   Resp 20   Ht 5' (1.524 m)   Wt 60.3 kg   SpO2 100%   BMI 25.97 kg/m  Physical Exam Vitals and nursing note reviewed.  Constitutional:      General: She is not in acute distress.    Appearance: She is well-developed. She is not ill-appearing.  HENT:     Head: Normocephalic and atraumatic.     Nose: Nose normal.     Mouth/Throat:     Mouth: Mucous membranes are moist.  Eyes:     Extraocular Movements: Extraocular movements intact.     Conjunctiva/sclera: Conjunctivae normal.     Pupils: Pupils are equal, round, and reactive to light.  Cardiovascular:     Rate and Rhythm: Normal rate and regular rhythm.     Pulses: Normal pulses.     Heart sounds: Normal heart sounds. No murmur heard. Pulmonary:     Effort:  Pulmonary effort is normal. No respiratory distress.     Breath sounds: Normal breath sounds.  Abdominal:     Palpations: Abdomen is soft.     Tenderness: There is no abdominal tenderness.  Musculoskeletal:        General: No swelling.     Cervical back: Normal range of motion and neck supple.  Skin:    General: Skin is warm and dry.     Capillary Refill: Capillary refill takes less than 2 seconds.  Neurological:     Mental Status: She is alert.  Psychiatric:     Comments: Tearful, suicidal     ED Results / Procedures / Treatments   Labs (all labs ordered are listed, but only abnormal results are displayed) Labs Reviewed  COMPREHENSIVE METABOLIC PANEL - Abnormal; Notable for the following components:      Result Value   CO2 19 (*)    Glucose, Bld 100  (*)    Calcium 8.0 (*)    Total Bilirubin 0.2 (*)    All other components within normal limits  SALICYLATE LEVEL - Abnormal; Notable for the following components:   Salicylate Lvl <7.0 (*)    All other components within normal limits  ACETAMINOPHEN LEVEL - Abnormal; Notable for the following components:   Acetaminophen (Tylenol), Serum <10 (*)    All other components within normal limits  RESP PANEL BY RT-PCR (RSV, FLU A&B, COVID)  RVPGX2  ETHANOL  CBC WITH DIFFERENTIAL/PLATELET  RAPID URINE DRUG SCREEN, HOSP PERFORMED  BASIC METABOLIC PANEL  SALICYLATE LEVEL  ACETAMINOPHEN LEVEL  I-STAT BETA HCG BLOOD, ED ([facility], [facility], [facility] ONLY)    EKG EKG Interpretation  Date/Time:  Saturday April 05 2022 21:36:33 EST Ventricular Rate:  82 PR Interval:  141 QRS Duration: 97 QT Interval:  365 QTC Calculation: 427 R Axis:   18 Text Interpretation: Sinus rhythm Confirmed by Lockie Mola, Maxine Fredman ([id number]) on 04/05/2022 10:15:51 PM  Radiology No results found.  Procedures Procedures    Medications Ordered in ED Medications  sodium chloride 0.9 % bolus 1,000 mL (has no administration in time range)  charcoal activated (NO SORBITOL) (ACTIDOSE-AQUA) suspension 50 g (50 g Oral Given 04/05/22 2157)  sodium chloride 0.9 % bolus 1,000 mL (0 mLs Intravenous Stopped 04/05/22 2241)    ED Course/ Medical Decision Making/ A&P                             Medical Decision Making Amount and/or Complexity of Data Reviewed Labs: ordered.  Risk OTC drugs.   Gabriela Jenkins is here after suicide attempt.  Normal vitals.  No fever.  Patient took 12,600 mg ibuprofen at around 8 PM and attempt to self-harm.  She called her cousin afterwards.  She has a history of anxiety and depression.  She states that she has been under a lot of stress with work and school and family.  She denies any other coingestions including alcohol or illicit drugs or Tylenol.  EMS were able to count pills that were in the ibuprofen  bottle that she bought over-the-counter.  She denies any nausea or vomiting or abdominal pain.  She is tearful but seems fairly levelheaded at this time.  She understands that she made a mistake.  She reached out to her cousin immediately.  Overall she appears well with normal vitals.  She has been given charcoal.  Talked with poison control who recommend repeat Tylenol, salicylate level and electrolytes in  4 hours.  She is already had a set of labs done including CBC and CMP.  Bicarb is 19.  Creatinine normal.  Pregnancy test is negative.  Alcohol and Tylenol level are normal.  Per my review and interpretation labs otherwise they are unremarkable.  Will repeat salicylate, Tylenol and BMP 1230 am.  Per poison control if she is asymptomatic and bicarb and kidney function are unremarkable she can be medically cleared.  IVC has been filled.  Overall poison control states that the amount she took she should be able to metabolize without any issues.  Please see oncoming ED staff note for further workup, evaluation, disposition.  This chart was dictated using voice recognition software.  Despite best efforts to proofread,  errors can occur which can change the documentation meaning.         Final Clinical Impression(s) / ED Diagnoses Final diagnoses:  Intentional overdose, initial encounter [facility])  Intentional ibuprofen overdose, initial encounter [facility])    Rx / [place] Orders ED Discharge Orders     None         Lennice Sites, DO 04/05/22 2245

## 2022-04-06 DIAGNOSIS — T50901A Poisoning by unspecified drugs, medicaments and biological substances, accidental (unintentional), initial encounter: Secondary | ICD-10-CM | POA: Diagnosis present

## 2022-04-06 DIAGNOSIS — T50902A Poisoning by unspecified drugs, medicaments and biological substances, intentional self-harm, initial encounter: Secondary | ICD-10-CM

## 2022-04-06 DIAGNOSIS — R45851 Suicidal ideations: Secondary | ICD-10-CM

## 2022-04-06 DIAGNOSIS — F410 Panic disorder [episodic paroxysmal anxiety] without agoraphobia: Secondary | ICD-10-CM | POA: Diagnosis present

## 2022-04-06 LAB — BASIC METABOLIC PANEL
Anion gap: 4 — ABNORMAL LOW (ref 5–15)
BUN: 13 mg/dL (ref 6–20)
CO2: 17 mmol/L — ABNORMAL LOW (ref 22–32)
Calcium: 7.5 mg/dL — ABNORMAL LOW (ref 8.9–10.3)
Chloride: 117 mmol/L — ABNORMAL HIGH (ref 98–111)
Creatinine, Ser: 0.64 mg/dL (ref 0.44–1.00)
GFR, Estimated: >60 mL/min (ref >60)
Glucose, Bld: 105 mg/dL — ABNORMAL HIGH (ref 70–99)
Potassium: 3.7 mmol/L (ref 3.5–5.1)
Sodium: 138 mmol/L (ref 135–145)

## 2022-04-06 LAB — ACETAMINOPHEN LEVEL: Acetaminophen (Tylenol), Serum: <10 ug/mL — ABNORMAL LOW (ref 10–30)

## 2022-04-06 LAB — SALICYLATE LEVEL: Salicylate Lvl: <7 mg/dL — ABNORMAL LOW (ref 7.0–30.0)

## 2022-04-06 NOTE — ED Provider Notes (Signed)
Emergency Medicine Observation Re-evaluation Note  Gabriela Jenkins is a 22 y.o. female, seen on rounds today.  Pt initially presented to the ED for complaints of Ingestion Currently, the patient is ***.  Physical Exam  BP 103/69   Pulse 99   Temp 98.1 F (36.7 C) (Oral)   Resp 19   Ht 5' (1.524 m)   Wt 60.3 kg   SpO2 98%   BMI 25.97 kg/m  Physical Exam General: *** Cardiac: *** Lungs: *** Psych: ***  ED Course / MDM  EKG:EKG Interpretation  Date/Time:  Saturday April 05 2022 21:36:33 EST Ventricular Rate:  82 PR Interval:  141 QRS Duration: 97 QT Interval:  365 QTC Calculation: 427 R Axis:   18 Text Interpretation: Sinus rhythm Confirmed by Lennice Sites ([id number]) on 04/05/2022 10:15:51 PM  I have reviewed the labs performed to date as well as medications administered while in observation.  Recent changes in the last 24 hours include ***.  Plan  Current plan is for psychiatry disposition. Has been medically cleared following overdose.  Under IVC.

## 2022-04-06 NOTE — ED Notes (Signed)
Patty RN from [employer] stated they are going to close this case

## 2022-04-06 NOTE — ED Provider Notes (Signed)
I assumed care of this patient.  Please see previous provider note for further details of Hx, PE.  Briefly patient is a 22 y.o. female who presented after suicide attempt with motrin. Given charcoal. IVC filled.  Labs reassuring. Pending repeat.  Repeat labs reassuring. Poison control cleared patient.  [facility] consulted for management and dispo.      Fatima Blank, MD 04/06/22 [phone]

## 2022-04-06 NOTE — ED Notes (Signed)
Pt had no sitter for entire time here in ER

## 2022-04-06 NOTE — Discharge Summary (Signed)
[facility] Psych ED Discharge  04/06/2022 2:01 PM Gabriela Jenkins  MRN:  [id number]  Principal Problem: Panic Discharge Diagnoses: Principal Problem:   Panic Active Problems:   Generalized anxiety disorder   Suicidal ideation  Clinical Impression:  Final diagnoses:  Intentional overdose, initial encounter (HCC)  Intentional ibuprofen overdose, initial encounter [name]    ED Assessment Time Calculation: Start Time: 1035 Stop Time: 1120 Total Time in Minutes (Assessment Completion): 45   Subjective: Gabriela Jenkins, 22 y.o., female patient seen face to face by this provider, consulted with Dr. Lucianne Muss; and chart reviewed on 04/06/22.  On evaluation Gabriela Jenkins reports that she has been seeing a counselor since August, Trixie Deis and said that this counselor has been teaching her how to use coping skills and how to do more positive self talk.  She says that a week ago March 29, 2022 she and boyfriend agreed that she should take a Plan B birth control pill and she said the week after she feels like her hormone fluctuated.  Says that before she took that Plan B pill she has been eating and sleeping well no distress.  Said that after she took the Plan B she was having a hard time focusing in class she became upset with boyfriend and family for no reason says that she tried to be on birth control was having the same type of hormonal fluctuations and stopped taking it.  Provider discussed with her that she needs to talk with her OB/GYN and attempt to get on a birth control pill that her body will respond well to.  Patient agreed.  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  Patient stated that yesterday she had a great date with family, she lives with mother and father and 2 younger brothers, says that she and  her family are getting along much better, says she hung out with her uncle but later last night she had a nightmare of trauma that she did not actually go through by her cousin going through, says that this freaked her out.  Patient said when she woke up she was is having some anxiety and it was felt like pain in her chest continues to say that she did not want to die she just wanted the pain and anxiety to go away.  Patient denies SI/HI/AVH.  Denies using any illicit drugs or alcohol.  Patient denies having any past psychiatric inpatient treatment, she was on Wellbutrin but abruptly stopped after being on it for 2 months because it was 2 doses a day and she was forgetting to take a dose, so she stopped it altogether.  Patient does have a diagnosis of ADHD and anxiety.  Patient says that school is going well she is passing her classes she has become more social this year, her relationship with her boyfriend and family are going well.  During evaluation Gabriela Jenkins is sitting comfortably in conference room with no acute distress. She is alert, oriented x 4, calm, cooperative and attentive.  Her mood is euthymic with congruent affect, tearful at times. She has normal speech, and appropriate behavior.  Objectively there is no evidence of psychosis/mania or delusional thinking.  Patient is able to converse coherently, goal directed thoughts, no distractibility, or pre-occupation. She denies suicidal/self-harm/homicidal ideation, psychosis,  and paranoia.  Patient appears to have some insight about her situation, knows the damage that she could have caused, and has been very tearful and apologetic.  Patient appears to be very bright, and cooperative.  Patient says that she has been working with her counselor consistently and has been trying to use the coping skills provided to her by her counselor, she knows that this is impulsive instance that have cost her a lot. Provider discussed with her the consequences  of unintentional/intentional overdose, encouraged her to increase her therapy sessions, and possibly medication management.  Provider spoke with patient's father Farrell Ours, who was waiting for patient at bedside when patient and provider walked up, spoke with patient's father.  Language cart, and father states that he feels she is safe to go back home, says that he is shocked that she tried this because she has never tried this before, says that she does have anxiety and depression, she lives with them so he and mother will take accountability for patient and will make sure that she is safe.  Father says he is very proud of her because of her school decisions, and opening up more to family, but he is also open to her receiving help with mental health.   At this time Gabriela Jenkins is educated and verbalizes understanding of mental health resources and other crisis services in the community. She is instructed to call 911 and present to the nearest emergency room should she experience any suicidal/homicidal ideation, auditory/visual/hallucinations, or detrimental worsening of her mental health condition.   Past Psychiatric History: Anxiety, depression, and ADHD  Past Medical History:  Past Medical History:  Diagnosis Date   Hyperlipidemia     Past Surgical History:  Procedure Laterality Date   POLYDACTYLY RECONSTRUCTION     Family History:  Family History  Problem Relation Age of Onset   Asthma Father    Hypertension Maternal Grandmother    Hyperlipidemia Maternal Grandfather    Heart disease Maternal Uncle     Social History:  Social History   Substance and Sexual Activity  Alcohol Use Never     Social History   Substance and Sexual Activity  Drug Use Never    Social History   Socioeconomic History   Marital status: Single    Spouse name: Not on file   Number of children: Not on file   Years of education: Not on file   Highest education level: 11th grade   Occupational History   Not on file  Tobacco Use   Smoking status: Never   Smokeless tobacco: Never  Vaping Use   Vaping Use: Never used  Substance and Sexual Activity   Alcohol use: Never   Drug use: Never   Sexual activity: Yes    Birth control/protection: None  Other Topics Concern   Not on file  Social History Narrative   Not on file   Social Determinants of Health   Financial Resource Strain: Not on file  Food Insecurity: Not on file  Transportation Needs: Not on file  Physical Activity: Not on file  Stress: Not on file  Social Connections: Not on file    Tobacco Cessation:  N/A, patient does not currently use tobacco products  Current Medications: No current facility-administered medications for this encounter.   Current Outpatient Medications  Medication Sig Dispense Refill   buPROPion (WELLBUTRIN XL) 150 MG 24 hr tablet Take 1 tablet (150 mg total) by mouth daily. (Patient not taking: Reported on 04/06/2022)  30 tablet 1   Cholecalciferol (VITAMIN D3) 25 MCG (1000 UT) CAPS Take 1 capsule (1,000 Units total) by mouth daily. (Patient not taking: Reported on 04/06/2022) 90 capsule 1   LO LOESTRIN FE 1 MG-10 MCG / 10 MCG tablet Take 1 tablet by mouth daily. (Patient not taking: Reported on 01/03/2022) 90 tablet 3   tiZANidine (ZANAFLEX) 4 MG tablet Take one tablet by mouth every 12 hours as needed for muscle pain and spasms. May make you sleepy- use caution until you know how medication will affect you. (Patient not taking: Reported on 01/28/2022) 30 tablet 1   PTA Medications: (Not in a hospital admission)   [place] Scale:  [facility] from 04/05/2022 in [facility] Emergency Department at [place] No Risk       Musculoskeletal: Strength & Muscle Tone: within normal limits Gait & Station: normal Patient leans: N/A  Psychiatric Specialty Exam: Presentation  General Appearance:  Appropriate for Environment  Eye  Contact: Good  Speech: Clear and Coherent  Speech Volume: Normal  Handedness:No data recorded  Mood and Affect  Mood: Anxious  Affect: Appropriate   Thought Process  Thought Processes: Coherent  Descriptions of Associations:Intact  Orientation:Full (Time, Place and Person)  Thought Content:WDL  History of Schizophrenia/Schizoaffective disorder:No data recorded Duration of Psychotic Symptoms:No data recorded Hallucinations:Hallucinations: None  Ideas of Reference:None  Suicidal Thoughts:Suicidal Thoughts: No  Homicidal Thoughts:Homicidal Thoughts: No   Sensorium  Memory: Immediate Good; Recent Good  Judgment: Fair  Insight: Good   Executive Functions  Concentration: Good  Attention Span: Good  Recall: Good  Fund of Knowledge: Good  Language: Good   Psychomotor Activity  Psychomotor Activity: Psychomotor Activity: Normal   Assets  Assets: Communication Skills; Desire for Improvement; Financial Resources/Insurance; Housing; Social Support; Intimacy; Physical Health; Transportation; Vocational/Educational; Leisure Time   Sleep  Sleep: Sleep: Fair    Physical Exam: Physical Exam Eyes:     Pupils: Pupils are equal, round, and reactive to light.  Abdominal:     General: Abdomen is flat.  Musculoskeletal:     Cervical back: Normal range of motion.  Neurological:     Mental Status: She is alert.  Psychiatric:        Mood and Affect: Mood normal.        Behavior: Behavior normal.        Thought Content: Thought content normal.        Judgment: Judgment normal.    Review of Systems  Constitutional: Negative.   HENT: Negative.    Respiratory: Negative.    Musculoskeletal: Negative.   Psychiatric/Behavioral: Negative.     Blood pressure 103/69, pulse 99, temperature 98 F (36.7 C), temperature source Oral, resp. rate 19, height 5' (1.524 m), weight 60.3 kg, SpO2 98 %. Body mass index is 25.97 kg/m.   Demographic  Factors:  Adolescent or young adult  Loss Factors: NA  Historical Factors: Impulsivity  Risk Reduction Factors:   Sense of responsibility to family, Religious beliefs about death, Employed, Living with another person, especially a relative, Positive social support, and Positive coping skills or problem solving skills  Continued Clinical Symptoms:  Panic Attacks Depression:   Anhedonia  Cognitive Features That Contribute To Risk:  None    Suicide Risk:  Minimal: No identifiable suicidal ideation.  Patients presenting with no risk factors but with morbid ruminations; may be classified as minimal risk based on the severity of the depressive symptoms   Medical Decision Making:  Patient is psychiatrically cleared.  Sent a message to IOP to follow-up with patient after discharge.  Consulted with Dr. Lucianne Muss.  Patient and father are agreeing to follow-up with IOP. At time of discharge, patient denies SI, HI, AVH and is able to contract for safety. She demonstrated no overt evidence of psychosis or mania. Prior to discharge, patient verbalized that they understood warning signs, triggers, and symptoms of worsening mental health and how to access emergency mental health care if they felt it was needed. Patient was instructed to call 911 or return to the emergency room if they experienced any concerning symptoms after discharge. Patient voiced understanding and agreed to the above.  Resources given for behavioral health urgent care.  Safety Plan Gabriela Jenkins will reach out to his/her counselor, call 911 or call mobile crisis, or go to nearest emergency room if condition worsens or if suicidal thoughts become active Patients' will follow up with [facility] for outpatient psychiatric services (therapy/medication management).  The suicide prevention education provided includes the following: Suicide risk factors Suicide prevention and interventions National Suicide Hotline telephone number [facility] assessment telephone number [facility] Emergency Assistance 911 [facility] and/or Residential Mobile Crisis Unit telephone number Request made of family/significant other to:  Rosalie Doctor (father) Remove weapons (e.g., guns, rifles, knives), all items previously/currently identified as safety concern.   Remove drugs/medications (over the counter, prescriptions, illicit drugs), all items previously/currently identified as a safety concern.     Disposition: Patient does not meet criteria for psychiatric inpatient admission. Supportive therapy provided about ongoing stressors. Refer to IOP. Discussed crisis plan, support from social network, calling 911, coming to the Emergency Department, and calling Suicide Hotline.     Anaston Koehn MOTLEY-MANGRUM, PMHNP 04/06/2022, 2:01 PM

## 2022-04-07 ENCOUNTER — Telehealth (HOSPITAL_COMMUNITY): Payer: Self-pay | Admitting: Professional

## 2022-04-09 ENCOUNTER — Ambulatory Visit (HOSPITAL_COMMUNITY): Payer: Medicaid Other | Admitting: Licensed Clinical Social Worker

## 2022-04-09 DIAGNOSIS — F411 Generalized anxiety disorder: Secondary | ICD-10-CM

## 2022-04-09 DIAGNOSIS — F339 Major depressive disorder, recurrent, unspecified: Secondary | ICD-10-CM
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
## 2022-04-18 ENCOUNTER — Encounter (HOSPITAL_BASED_OUTPATIENT_CLINIC_OR_DEPARTMENT_OTHER): Payer: Self-pay

## 2022-05-05 ENCOUNTER — Encounter (HOSPITAL_BASED_OUTPATIENT_CLINIC_OR_DEPARTMENT_OTHER): Payer: Self-pay | Admitting: Family Medicine

## 2022-05-05 ENCOUNTER — Ambulatory Visit (INDEPENDENT_AMBULATORY_CARE_PROVIDER_SITE_OTHER): Payer: Medicaid Other | Admitting: Family Medicine

## 2022-05-05 VITALS — BP 127/79 | HR 82 | Ht 60.0 in | Wt 137.5 lb

## 2022-05-05 DIAGNOSIS — L920 Granuloma annulare: Secondary | ICD-10-CM | POA: Diagnosis not present

## 2022-05-05 DIAGNOSIS — R7303 Prediabetes: Secondary | ICD-10-CM | POA: Diagnosis not present

## 2022-05-05 NOTE — Assessment & Plan Note (Addendum)
On 01/03/2022 her A1c was 5.8.  Request A1c to be done today to assess prediabetes.  She will follow-up with her PCP in 1 to 2 weeks for prediabetes.  Denies increased thirst and increased urination.

## 2022-05-05 NOTE — Progress Notes (Signed)
   Established Patient Office Visit  Subjective   Patient ID: Gabriela Jenkins, female    DOB: 2000/11/19  Age: 22 y.o. 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 On both feet and ankles, spreading up calf, knees and abdomen now. No itching. This is a chronic condition. Needs derm referral today.   Review of Systems  Constitutional:  Negative for chills and fever.  Eyes:  Negative for blurred vision and double vision.  Respiratory:  Negative for shortness of breath.   Cardiovascular:  Negative for chest pain.  Gastrointestinal:  Negative for abdominal pain, nausea and vomiting.  Skin:  Positive for rash. Negative for itching.  Neurological:  Negative for dizziness and headaches.  Psychiatric/Behavioral:  Negative for depression and suicidal ideas.       Objective:     BP 127/79 (BP Location: Left Arm, Patient Position: Sitting, Cuff Size: Normal)   Pulse 82   Ht 5' (1.524 m)   Wt 137 lb 8 oz (62.4 kg)   SpO2 100%   BMI 26.85 kg/m  BP Readings from Last 3 Encounters:  05/05/22 127/79  04/06/22 117/62  03/11/22 111/72      Physical Exam Vitals and nursing note reviewed.  Constitutional:      General: She is not in acute distress.    Appearance: Normal appearance.  Cardiovascular:     Rate and Rhythm: Normal rate and regular rhythm.     Heart sounds: Normal heart sounds.  Pulmonary:     Effort: Pulmonary effort is normal.     Breath sounds: Normal breath sounds.  Skin:    General: Skin is warm and dry.     Findings: Rash (diffuse papular) present.  Neurological:     Mental Status: She is alert.      No results found for any visits on 05/05/22.    The ASCVD Risk score (Arnett DK, et al., 2019) failed to calculate for  the following reasons:   The 2019 ASCVD risk score is only valid for ages 55 to 81    Assessment & Plan:   Problem List Items Addressed This Visit     Granuloma annulare - Primary    Chronic condition since age 59. Has not seen derm in some time. Presents today for dermatology referral.       Relevant Orders   Ambulatory referral to Dermatology   Pre-diabetes    On 01/03/2022 her A1c was 5.8.  Request A1c to be done today to assess prediabetes.  She will follow-up with her PCP in 1 to 2 weeks for prediabetes.  Denies increased thirst and increased urination.      Relevant Orders   Hemoglobin A1c  Agrees with plan of care discussed.  Questions answered.   Return in about 2 weeks (around 05/19/2022) for for pre-diabetes (wants to see new NP) .    Chalmers Guest, FNP

## 2022-05-05 NOTE — Assessment & Plan Note (Signed)
Chronic condition since age 22. Has not seen derm in some time. Presents today for dermatology referral.

## 2022-05-06 LAB — HEMOGLOBIN A1C
Est. average glucose Bld gHb Est-mCnc: 114 mg/dL
Hgb A1c MFr Bld: 5.6 % (ref 4.8–5.6)

## 2022-05-23 ENCOUNTER — Ambulatory Visit (INDEPENDENT_AMBULATORY_CARE_PROVIDER_SITE_OTHER): Payer: Medicaid Other | Admitting: Family Medicine

## 2022-05-23 ENCOUNTER — Encounter (HOSPITAL_BASED_OUTPATIENT_CLINIC_OR_DEPARTMENT_OTHER): Payer: Self-pay | Admitting: Family Medicine

## 2022-05-23 VITALS — BP 120/81 | HR 76 | Ht 60.0 in | Wt 135.8 lb

## 2022-05-23 DIAGNOSIS — E559 Vitamin D deficiency, unspecified: Secondary | ICD-10-CM

## 2022-05-23 DIAGNOSIS — Z83438 Family history of other disorder of lipoprotein metabolism and other lipidemia: Secondary | ICD-10-CM | POA: Diagnosis not present

## 2022-05-23 DIAGNOSIS — R7303 Prediabetes: Secondary | ICD-10-CM | POA: Diagnosis not present

## 2022-05-23 DIAGNOSIS — M654 Radial styloid tenosynovitis [de Quervain]: Secondary | ICD-10-CM

## 2022-05-23 LAB — POCT GLYCOSYLATED HEMOGLOBIN (HGB A1C)
HbA1c POC (<> result, manual entry): 5.5 % (ref 4.0–5.6)
HbA1c, POC (controlled diabetic range): 5.5 % (ref 0.0–7.0)
HbA1c, POC (prediabetic range): 5.5 % — AB (ref 5.7–6.4)
Hemoglobin A1C: 5.5 % (ref 4.0–5.6)

## 2022-05-23 NOTE — Assessment & Plan Note (Signed)
Patient is in no acute distress or pain. No skin changes present at radial aspect of wrist or thumb. Normal active and passive wrist flexion, extension, radial and ulnar deviation.  Some tenderness to palpation present along first dorsal compartment.  Positive Finkelstein test. History and exam are most consistent with de Quervain's tenosynovitis. Advised patient to restart topical OTC Voltaren gel and use of thumb spica splint. She would like to proceed with for evaluation with occupational therapy for specialized hand injection, as discussed prior with Dr. [facility] [place].

## 2022-05-23 NOTE — Progress Notes (Signed)
Established Patient Office Visit  Subjective   Patient ID: Gabriela Jenkins, female    DOB: 2000/10/10  Age: 22 y.o. MRN: [id number]  C/O of R thumb pain still Seen in Nov 2023 for R thumb pain Last seen Jan 2024- did conservative management including Voltaren gel and use of thumb spica splint. 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 She also uses this hand to write.   Hgb A1C POCT 5.5 today- educated about pre-diabetes She is trying to get into exercising She has decreased fast food intake. More vegetables and protein-rich meats.   Mood- wellbutrin did not like taking twice a day. Was changed to once a day but concerned about her thoughts of suicide and taking the medication to possibly overdose. Currently not taking any medication. She denies current suicidal ideations. She is seeing a Social worker and reports that her mood is a lot better.    Review of Systems  Musculoskeletal:  Positive for joint pain (R thumb).  Psychiatric/Behavioral:  Negative for depression and suicidal ideas. The patient is not nervous/anxious.       Objective:     BP 120/81   Pulse 76   Ht 5' (1.524 m)   Wt 135 lb 12.8 oz (61.6 kg)   LMP 05/17/2022   SpO2 100%   BMI 26.52 kg/m  BP Readings from Last 3 Encounters:  05/23/22 120/81  05/05/22 127/79  04/06/22 117/62   Wt Readings from Last 3 Encounters:  05/23/22 135 lb 12.8 oz (61.6 kg)  05/05/22 137 lb 8 oz (62.4 kg)  04/05/22 133 lb (60.3 kg)      Physical Exam Cardiovascular:     Rate and Rhythm: Normal rate and regular rhythm.  Pulmonary:     Effort: Pulmonary effort is normal.     Breath sounds: Normal breath sounds.  Musculoskeletal:     Right wrist: Tenderness present. No swelling or bony tenderness. Decreased range of motion.  Neurological:     Mental Status: She is alert.  Psychiatric:        Mood and Affect: Mood normal.        Behavior: Behavior normal.        Thought Content: Thought content normal.         Judgment: Judgment normal.    Results for orders placed or performed in visit on 05/23/22  POCT HgB A1C  Result Value Ref Range   Hemoglobin A1C 5.5 4.0 - 5.6 %   HbA1c POC (<> result, manual entry) 5.5 4.0 - 5.6 %   HbA1c, POC (prediabetic range) 5.5 (A) 5.7 - 6.4 %   HbA1c, POC (controlled diabetic range) 5.5 0.0 - 7.0 %       Assessment & Plan:   Problem List Items Addressed This Visit       Musculoskeletal and Integument   De Quervain's tenosynovitis, right    Patient is in no acute distress or pain. No skin changes present at radial aspect of wrist or thumb. Normal active and passive wrist flexion, extension, radial and ulnar deviation.  Some tenderness to palpation present along first dorsal compartment.  Positive Finkelstein test. History and exam are most consistent with de Quervain's tenosynovitis. Advised patient to restart topical OTC Voltaren gel and use of thumb spica splint. She would like to proceed with for evaluation with occupational therapy for specialized hand injection, as discussed prior with Dr. [place].         Other  Vitamin D deficiency    Patient has history of vitamin D deficiency. She reports taking vitamin D3 supplement for the past 2 months. Will recheck levels today.       Relevant Orders   VITAMIN D 25 Hydroxy (Vit-D Deficiency, Fractures)   Pre-diabetes - Primary    POCT A1C today is within a normal range at 5.5. Discussed continuing healthy lifestyle modifications. Educated her about consuming healthy foods (fruits, vegetable, fish, lean meats). Recommend moderate walking, 3-5 times/week for 30-50 minutes each session. Aim for at least 150 minutes.week. Goal should be pace of 3 miles/hours, or walking 1.5 miles in 30 minutes.       Relevant Orders   POCT HgB A1C (Completed)   Lipid panel   Other Visit Diagnoses     Family history of hyperlipidemia       Patient has a history of elevated A1C about 4 months ago. Will check lipids to  ensure these are not elevated also. Will advise paitent with results        Return for October 2024 for CPE.    Les Pou, FNP

## 2022-05-23 NOTE — Assessment & Plan Note (Signed)
POCT A1C today is within a normal range at 5.5. Discussed continuing healthy lifestyle modifications. Educated her about consuming healthy foods (fruits, vegetable, fish, lean meats). Recommend moderate walking, 3-5 times/week for 30-50 minutes each session. Aim for at least 150 minutes.week. Goal should be pace of 3 miles/hours, or walking 1.5 miles in 30 minutes.

## 2022-05-23 NOTE — Assessment & Plan Note (Signed)
Patient has history of vitamin D deficiency. She reports taking vitamin D3 supplement for the past 2 months. Will recheck levels today.

## 2022-05-24 LAB — LIPID PANEL
Chol/HDL Ratio: 6.3 ratio — ABNORMAL HIGH (ref 0.0–4.4)
Cholesterol, Total: 175 mg/dL (ref 100–199)
HDL: 28 mg/dL — ABNORMAL LOW (ref >39)
LDL Chol Calc (NIH): 130 mg/dL — ABNORMAL HIGH (ref 0–99)
Triglycerides: 93 mg/dL (ref 0–149)
VLDL Cholesterol Cal: 17 mg/dL (ref 5–40)

## 2022-05-24 LAB — VITAMIN D 25 HYDROXY (VIT D DEFICIENCY, FRACTURES): Vit D, 25-Hydroxy: 49.7 ng/mL (ref 30.0–100.0)

## 2022-05-25 ENCOUNTER — Other Ambulatory Visit (HOSPITAL_BASED_OUTPATIENT_CLINIC_OR_DEPARTMENT_OTHER): Payer: Self-pay | Admitting: Family Medicine

## 2022-05-30 ENCOUNTER — Encounter (HOSPITAL_BASED_OUTPATIENT_CLINIC_OR_DEPARTMENT_OTHER): Payer: Self-pay

## 2022-06-16 ENCOUNTER — Ambulatory Visit (INDEPENDENT_AMBULATORY_CARE_PROVIDER_SITE_OTHER): Payer: Medicaid Other | Admitting: Dermatology

## 2022-06-16 VITALS — BP 108/71

## 2022-06-16 DIAGNOSIS — L920 Granuloma annulare: Secondary | ICD-10-CM | POA: Diagnosis not present

## 2022-06-16 DIAGNOSIS — L810 Postinflammatory hyperpigmentation: Secondary | ICD-10-CM

## 2022-06-16 DIAGNOSIS — L219 Seborrheic dermatitis, unspecified: Secondary | ICD-10-CM

## 2022-06-16 MED ORDER — CLOBETASOL PROPIONATE 0.05 % EX SOLN: 1.0000 | Freq: Every day | CUTANEOUS | 1 refills | Status: DC

## 2022-06-16 MED ORDER — CLOBETASOL PROPIONATE 0.05 % EX OINT: 1.0000 | TOPICAL_OINTMENT | Freq: Two times a day (BID) | CUTANEOUS | 1 refills | Status: AC

## 2022-06-16 MED ORDER — TACROLIMUS 0.1 % EX OINT: TOPICAL_OINTMENT | Freq: Two times a day (BID) | CUTANEOUS | 0 refills | Status: DC

## 2022-06-16 NOTE — Patient Instructions (Addendum)
Start Clobetasol ointment bid to affected areas of feet and ankles x 2 weeks. Advised patient to use 2 weeks on and 2 weeks off. On the off weeks, start tacrolimus ointment twice daily to affected areas of feet and ankles    Due to recent changes in healthcare laws, you may see results of your pathology and/or laboratory studies on MyChart before the doctors have had a chance to review them. We understand that in some cases there may be results that are confusing or concerning to you. Please understand that not all results are received at the same time and often the doctors may need to interpret multiple results in order to provide you with the best plan of care or course of treatment. Therefore, we ask that you please give [place] 2 business days to thoroughly review all your results before contacting the office for clarification. Should we see a critical lab result, you will be contacted sooner.   If You Need Anything After Your Visit  If you have any questions or concerns for your doctor, please call our main line at [phone] If no one answers, please leave a voicemail as directed and we will return your call as soon as possible. Messages left after 4 pm will be answered the following business day.   You may also send [place] a message via MyChart. We typically respond to MyChart messages within 1-2 business days.  For prescription refills, please ask your pharmacy to contact our office. Our fax number is [phone].  If you have an urgent issue when the clinic is closed that cannot wait until the next business day, you can page your doctor at the number below.    Please note that while we do our best to be available for urgent issues outside of office hours, we are not available 24/7.   If you have an urgent issue and are unable to reach [place], you may choose to seek medical care at your doctor's office, retail clinic, urgent care center, or emergency room.  If you have a medical emergency,  please immediately call 911 or go to the emergency department. In the event of inclement weather, please call our main line at [phone] for an update on the status of any delays or closures.  Dermatology Medication Tips: Please keep the boxes that topical medications come in in order to help keep track of the instructions about where and how to use these. Pharmacies typically print the medication instructions only on the boxes and not directly on the medication tubes.   If your medication is too expensive, please contact our office at [phone] or send [place] a message through MyChart.   We are unable to tell what your co-pay for medications will be in advance as this is different depending on your insurance coverage. However, we may be able to find a substitute medication at lower cost or fill out paperwork to get insurance to cover a needed medication.   If a prior authorization is required to get your medication covered by your insurance company, please allow [place] 1-2 business days to complete this process.  Drug prices often vary depending on where the prescription is filled and some pharmacies may offer cheaper prices.  The website www.goodrx.com contains coupons for medications through different pharmacies. The prices here do not account for what the cost may be with help from insurance (it may be cheaper with your insurance), but the website can give you the price if you did not use  any insurance.  - You can print the associated coupon and take it with your prescription to the pharmacy.  - You may also stop by our office during regular business hours and pick up a GoodRx coupon card.  - If you need your prescription sent electronically to a different pharmacy, notify our office through [facility] or by phone at [phone]

## 2022-06-16 NOTE — Progress Notes (Signed)
   New Patient Visit   Subjective  Gabriela Jenkins is a 22 y.o. 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 It is appearing on feet, ankles, calves, and abdomen. Last treatment was in 2015 with UVB and topical creams.   The following portions of the chart were reviewed this encounter and updated as appropriate: medications, allergies, medical history  Review of Systems:  No other skin or systemic complaints except as noted in HPI or Assessment and Plan.  Objective  Well appearing patient in no apparent distress; mood and affect are within normal limits.   A focused examination was performed of the following areas: Torso, ankles, and feet.  Relevant exam findings are noted in the Assessment and Plan.  Areas of hyperpigmentation at previous areas of Granuloma Annulare    Assessment & Plan  SEBORRHEIC DERMATITIS Exam: Pink patches with greasy scale at scalp  Seborrheic Dermatitis is a chronic persistent rash characterized by pinkness and scaling most commonly of the mid face but also can occur on the scalp (dandruff), ears; mid chest, mid back and groin.  It tends to be exacerbated by stress and cooler weather.  People who have neurologic disease may experience new onset or exacerbation of existing seborrheic dermatitis.  The condition is not curable but treatable and can be controlled.  Treatment Plan: Clobetasol solution for scalp DHS Zinc shampoo    Postinflammatory hyperpigmentation  -Reassurance -SPF daily to prevent worsening    Granuloma Annulare Exam: erythematous annular plaques on the left dorsal foot and left ankle  Treatment Plan: Counseled patient on GA and triggers including diet.  Start Clobetasol ointment bid to affected areas of feet and ankles x 2 weeks. Advised patient to use 2 weeks on and 2 weeks off. On the off weeks, start tacrolimus ointment twice daily to affected areas of feet  and ankles   Return in about 6 weeks (around 07/28/2022).  Jaclynn Guarneri, CMA, am acting as scribe for [employer], DO.     Documentation: I have reviewed the above documentation for accuracy and completeness, and I agree with the above.  [name], DO

## 2022-06-20 ENCOUNTER — Ambulatory Visit: Payer: Medicaid Other | Attending: Family Medicine | Admitting: Occupational Therapy

## 2022-06-20 ENCOUNTER — Other Ambulatory Visit: Payer: Self-pay

## 2022-06-20 ENCOUNTER — Other Ambulatory Visit: Payer: Self-pay | Admitting: Dermatology

## 2022-06-20 DIAGNOSIS — M6281 Muscle weakness (generalized): Secondary | ICD-10-CM | POA: Diagnosis present

## 2022-06-20 DIAGNOSIS — M79641 Pain in right hand: Secondary | ICD-10-CM | POA: Diagnosis present

## 2022-06-20 DIAGNOSIS — M654 Radial styloid tenosynovitis [de Quervain]: Secondary | ICD-10-CM | POA: Insufficient documentation

## 2022-06-20 NOTE — Therapy (Signed)
OUTPATIENT OCCUPATIONAL THERAPY ORTHO EVALUATION  Patient Name: Gabriela Jenkins MRN: [id number] DOB:[birth year], 22 y.o., female Today's Date: 06/20/2022  PCP: Alyson Reedy, FNP REFERRING PROVIDER: Alyson Reedy, FNP  END OF SESSION:  OT End of Session - 06/20/22 1203     Visit Number 1    Number of Visits 9    Date for OT Re-Evaluation 08/15/22    Authorization Type [place] [employer]    OT Start Time 1014    OT Stop Time 1056    OT Time Calculation (min) 42 min             Past Medical History:  Diagnosis Date   Hyperlipidemia    Past Surgical History:  Procedure Laterality Date   POLYDACTYLY RECONSTRUCTION     Patient Active Problem List   Diagnosis Date Noted   Granuloma annulare 05/05/2022   Pre-diabetes 05/05/2022   Panic 04/06/2022   Overdose by ingestion 04/06/2022   De Quervain's tenosynovitis, right 01/28/2022   Vitamin D deficiency 01/28/2022   Encounter for medical examination to establish care 01/07/2022   Depression, recurrent 01/07/2022   Generalized anxiety disorder 01/07/2022   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  Pt reports decreased ability to move or open hand completely.  Pt reports wrist and hand pain, but wrist pain in going down.  Notices difficulty with opening hand to grasp items, difficulty with getting thumb open and "getting  stuck".  Pt has reported trying topical cream and trying a splint with some alleviation.  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   Pt accompanied by: self  PERTINENT HISTORY: DeQuervain's tenosynovitis, pre-diabetes, depression with h/o overdose  PRECAUTIONS: None  WEIGHT BEARING RESTRICTIONS: No  PAIN:  Are you having pain? No  FALLS: Has patient fallen in last 6 months? 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  FUNCTIONAL OUTCOME MEASURES: Quick Dash: 36.67% disability score, 75% disability on work module with pt scoring all 4 at severe difficulty  UPPER EXTREMITY ROM:   Normal active and passive wrist flexion, extension, radial and ulnar deviation.  Some tenderness to palpation present along first dorsal compartment.    Active ROM Right eval Left eval  Thumb MCP (0-60) Onset of pain ~90% extension   Thumb IP (0-80)    Thumb Radial abd/add (0-55)     Thumb Palmar abd/add (0-45)     Thumb Opposition to Small Finger WNL  Index MCP (0-90)     Index PIP (0-100)     Index DIP (0-70)      Long MCP (0-90)      Long PIP (0-100)      Long DIP (0-70)      Ring MCP (0-90)      Ring PIP (0-100)      Ring DIP (0-70)      Little MCP (0-90)      Little PIP (0-100)      Little DIP (0-70)      (Blank rows = not tested)  HAND FUNCTION: Grip strength: Right: 34 lbs; Left: 46 lbs, Lateral pinch: Right: 12 lbs,  Left: 13 lbs, and 3 point pinch: Right: 9 lbs, Left: 10 lbs  COORDINATION: 9 Hole Peg test: Right: 28.25 sec; Left: 28.25 sec Box and Blocks:  Right 36 blocks, Left 39 blocks  SENSATION: Increased sensitivity to touch  EDEMA: mild at base of R thumb  COGNITION: Overall cognitive status: Within functional limits for tasks assessed   TODAY'S TREATMENT:                                                                                                                              06/20/22 Therex: educated pt on gentle AROM with focus on thumb flexion, radial adduction, thumb opposite, and CMC stabilization.  OT encouraged slow, smooth movements and sustained hold for 3-5 seconds.  Pt able to return demonstration of understanding.   PATIENT EDUCATION: Education details: Educated on role and purpose of OT as well as potential interventions and goals for therapy based on initial evaluation findings. Person educated: Patient Education method: Explanation, Demonstration, and Handouts Education comprehension: verbalized understanding and needs further education  HOME EXERCISE PROGRAM: Access Code: [email or web address] URL: [email or web address] Date: 06/20/2022 Prepared by: [facility] - Outpatient  Rehab - [facility]  Program Notes Only stretch as far as your own edge.Don't force yourself into any position.Make sure you refrain from doing any jerky movements.Keep your movements even, slow, and smooth.  Exercises - Seated Composite Thumb Flexion AROM  - 2-3 x daily - 10-15 reps - 5 hold - Thumb Opposition  - 2-3 x daily - 10-15 reps - 2-3 hold - Thumb Radial Adduction with Thumb Flexion AROM on Table  - 2-3 x daily - 10-15 reps - 2-3 hold - Thumb Strengthening Stabilization CMC  - 2-3 x daily - 10-15 reps  GOALS: Goals reviewed with patient? Yes  SHORT TERM GOALS: Target date: 07/18/22  Pt will be Independent with HEP for strengthening and decrease pain in joints of finger/hands  to increase independence with ADLs and IADLs  Baseline: no current HEP Goal status: INITIAL  2.  Pt will verbalize understanding of adapted strategies and AE as needed to maximize safety and Independence with ADLs/ IADLs  Baseline: decreased strategies in use to decrease onset of pain/locking Goal status: INITIAL  3.  Pt will be able to demonstrate increased grip  strength as need to open containers as needed with use of RUE and/or with use of AE as needed. Baseline: Grip strength: Right: 34 lbs; Left: 46 lbs Goal status: INITIAL  4.  Pt will be able to carry a shopping bar or suitcase in dominant RUE with mild difficulty/pain or less. Baseline: reports mod difficulty Goal status: INITIAL   LONG TERM GOALS: Target date: 08/15/22  Pt will verbalize and demonstrate functional improvements in RUE with score </= to 25% on Quick Dash. Baseline: 36.67% Goal status: INITIAL  2.  Pt will independent with splint wear and care (PRN). Baseline: need for splint TBD Goal status: INITIAL  3.  Pt will demonstrate improved UE functional use for ADLs as evidenced by increasing box/ blocks score by 3 blocks with RUE. Baseline: R: 36 and L: 39 blocks Goal status: INITIAL  4.  Pt will report decreased onset of pain with school/work tasks down to mild-mod difficulty/pain. Baseline: severe difficulty Goal status: INITIAL   ASSESSMENT:  CLINICAL IMPRESSION: Patient is a 22 y.o. female who was seen today for occupational therapy evaluation for R thumb/wrist pain s/p diagnosis of DeQuervain's tenosynovitis. Pt reports most pain with writing, typing, and work tasks.  Pt reports thumb will occasionally get stuck out after opening hand wide to pick up a larger item.  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  Pt will benefit from skilled occupational therapy services to address strength and coordination, ROM, pain management, safety awareness, introduction of compensatory strategies/AE prn, and  implementation of an HEP to improve participation and safety during IADLs and school and work tasks.   PERFORMANCE DEFICITS: in functional skills including ADLs, IADLs, coordination, edema, ROM, strength, pain, flexibility, decreased knowledge of precautions, decreased knowledge of use of DME, and UE functional use and psychosocial skills including environmental adaptation.   IMPAIRMENTS: are limiting patient from IADLs, education, work, and leisure.   COMORBIDITIES: may have co-morbidities  that affects occupational performance. Patient will benefit from skilled OT to address above impairments and improve overall function.  MODIFICATION OR ASSISTANCE TO COMPLETE EVALUATION: No modification of tasks or assist necessary to complete an evaluation.  OT OCCUPATIONAL PROFILE AND HISTORY: Problem focused assessment: Including review of records relating to presenting problem.  CLINICAL DECISION MAKING: LOW - limited treatment options, no task modification necessary  REHAB POTENTIAL: Good  EVALUATION COMPLEXITY: Low      PLAN:  OT FREQUENCY: 1-2x/week  OT DURATION: 8 weeks (asking for 6 weeks over 8 week period due to travel plans during initial 6 week period)  PLANNED INTERVENTIONS: therapeutic exercise, therapeutic activity, neuromuscular re-education, manual therapy, passive range of motion, splinting, ultrasound, iontophoresis, paraffin, traction, compression bandaging, moist heat, cryotherapy, contrast bath, patient/family education, psychosocial skills training, and DME and/or AE instructions  RECOMMENDED OTHER SERVICES: NA  CONSULTED AND AGREED WITH PLAN OF CARE: Patient  PLAN FOR NEXT SESSION: review HEP, utilize heat and massage for pain reduction and attempt further AROM.  Could benefit from ultrasound.  Discuss splinting options   Hannahmarie Asberry, OTR/L 06/20/2022, 12:05 PM

## 2022-06-23 ENCOUNTER — Encounter (HOSPITAL_COMMUNITY): Payer: Self-pay

## 2022-06-23 ENCOUNTER — Emergency Department (HOSPITAL_COMMUNITY)
Admission: EM | Admit: 2022-06-23 | Discharge: 2022-06-23 | Disposition: A | Discharge status: Home / Self Care | Payer: Medicaid Other | Attending: Emergency Medicine | Admitting: Emergency Medicine

## 2022-06-23 DIAGNOSIS — D72829 Elevated white blood cell count, unspecified: Secondary | ICD-10-CM | POA: Diagnosis not present

## 2022-06-23 DIAGNOSIS — R1030 Lower abdominal pain, unspecified: Secondary | ICD-10-CM | POA: Diagnosis present

## 2022-06-23 DIAGNOSIS — N390 Urinary tract infection, site not specified: Secondary | ICD-10-CM

## 2022-06-23 LAB — CBC
HCT: 38.2 % (ref 36.0–46.0)
Hemoglobin: 12.6 g/dL (ref 12.0–15.0)
MCH: 28.9 pg (ref 26.0–34.0)
MCHC: 33 g/dL (ref 30.0–36.0)
MCV: 87.6 fL (ref 80.0–100.0)
Platelets: 299 10*3/uL (ref 150–400)
RBC: 4.36 MIL/uL (ref 3.87–5.11)
RDW: 13.1 % (ref 11.5–15.5)
WBC: 16.6 10*3/uL — ABNORMAL HIGH (ref 4.0–10.5)
nRBC: 0 % (ref 0.0–0.2)

## 2022-06-23 LAB — URINALYSIS, ROUTINE W REFLEX MICROSCOPIC
Bilirubin Urine: NEGATIVE
Glucose, UA: NEGATIVE mg/dL
Ketones, ur: NEGATIVE mg/dL
Nitrite: NEGATIVE
Protein, ur: 30 mg/dL — AB
RBC / HPF: >50 RBC/hpf (ref 0–5)
Specific Gravity, Urine: 1.01 (ref 1.005–1.030)
WBC, UA: >50 WBC/hpf (ref 0–5)
pH: 5 (ref 5.0–8.0)

## 2022-06-23 LAB — COMPREHENSIVE METABOLIC PANEL
ALT: 19 U/L (ref 0–44)
AST: 21 U/L (ref 15–41)
Albumin: 4 g/dL (ref 3.5–5.0)
Alkaline Phosphatase: 59 U/L (ref 38–126)
Anion gap: 9 (ref 5–15)
BUN: 14 mg/dL (ref 6–20)
CO2: 22 mmol/L (ref 22–32)
Calcium: 8.7 mg/dL — ABNORMAL LOW (ref 8.9–10.3)
Chloride: 106 mmol/L (ref 98–111)
Creatinine, Ser: 0.72 mg/dL (ref 0.44–1.00)
GFR, Estimated: >60 mL/min (ref >60)
Glucose, Bld: 113 mg/dL — ABNORMAL HIGH (ref 70–99)
Potassium: 3.6 mmol/L (ref 3.5–5.1)
Sodium: 137 mmol/L (ref 135–145)
Total Bilirubin: 0.3 mg/dL (ref 0.3–1.2)
Total Protein: 7.5 g/dL (ref 6.5–8.1)

## 2022-06-23 LAB — LIPASE, BLOOD: Lipase: 30 U/L (ref 11–51)

## 2022-06-23 LAB — PREGNANCY, URINE: Preg Test, Ur: NEGATIVE

## 2022-06-23 MED ORDER — CEPHALEXIN 500 MG PO CAPS
500.0000 mg | ORAL_CAPSULE | Freq: Once | ORAL | Status: AC
  Administered 2022-06-23: 500 mg via ORAL
  Filled 2022-06-23: qty 1

## 2022-06-23 MED ORDER — ONDANSETRON 4 MG PO TBDP: 4.0000 mg | ORAL_TABLET | Freq: Three times a day (TID) | ORAL | 0 refills | Status: AC | PRN

## 2022-06-23 MED ORDER — CEPHALEXIN 500 MG PO CAPS: 500.0000 mg | ORAL_CAPSULE | Freq: Three times a day (TID) | ORAL | 0 refills | Status: AC

## 2022-06-23 NOTE — ED Provider Notes (Signed)
[place] EMERGENCY DEPARTMENT AT [facility] Provider Note  CSN: [id number] Arrival date & time: 06/23/22 2125  Chief Complaint(s) Abdominal Pain  HPI Gabriela Jenkins is a 22 y.o. female LMP 06/16/22   Abdominal Pain Pain location:  Suprapubic Pain quality: cramping   Pain radiates to:  Does not radiate Pain severity:  Moderate Onset quality:  Gradual Duration:  2 days Timing:  Intermittent Progression:  Waxing and waning Chronicity:  New Context: not sick contacts   Relieved by:  Nothing Worsened by:  Nothing Associated symptoms: dysuria, hematuria and nausea   Associated symptoms: no constipation, no cough, no diarrhea, no fatigue, no fever, no vaginal bleeding, no vaginal discharge and no vomiting     Past Medical History Past Medical History:  Diagnosis Date   Hyperlipidemia    Patient Active Problem List   Diagnosis Date Noted   Granuloma annulare 05/05/2022   Pre-diabetes 05/05/2022   Panic 04/06/2022   Overdose by ingestion 04/06/2022   De Quervain's tenosynovitis, right 01/28/2022   Vitamin D deficiency 01/28/2022   Encounter for medical examination to establish care 01/07/2022   Depression, recurrent 01/07/2022   Generalized anxiety disorder 01/07/2022   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 Authorizing Provider  cephALEXin (KEFLEX) 500 MG capsule Take 1 capsule (500 mg total) by mouth 3 (three) times daily for 7 days. 06/23/22 06/30/22 Yes Keelon Zurn, Amadeo Garnet, MD  ondansetron (ZOFRAN-ODT) 4 MG disintegrating tablet Take 1 tablet (4 mg total) by mouth every 8 (eight) hours as needed for up to 3 days for nausea or vomiting. 06/23/22 06/26/22  Yes Ezekiel Menzer, Amadeo Garnet, MD  clobetasol (TEMOVATE) 0.05 % external solution Apply 1 Application topically daily. Apply to scalp as needed for itch 06/16/22   Terri Piedra, MD  clobetasol ointment (TEMOVATE) 0.05 % Apply 1 Application topically 2 (two) times daily. To affected areas on feet and ankles x 2 weeks. Wait 2 weeks before restarting 06/16/22   Terri Piedra, MD  tacrolimus (PROTOPIC) 0.1 % ointment Apply topically 2 (two) times daily. Apply to affected areas of feet and ankles 2 x daily on weeks when not using Clobetasol Patient not taking: Reported on 06/20/2022 06/16/22   Terri Piedra, MD                                                                                                                                    Allergies Pollen extract  Review of Systems Review of Systems  Constitutional:  Negative for fatigue and fever.  Respiratory:  Negative for cough.   Gastrointestinal:  Positive for abdominal pain and nausea. Negative for constipation, diarrhea and vomiting.  Genitourinary:  Positive for dysuria and hematuria. Negative for vaginal bleeding and vaginal discharge.   As noted in HPI  Physical Exam Vital Signs  I have reviewed the triage vital signs BP (!) 128/93   Pulse 94   Temp 98.4 F (36.9 C)   Resp 18   LMP 06/16/2022   SpO2 100%   Physical Exam Vitals reviewed.  Constitutional:      General: She is not in acute distress.    Appearance: She is well-developed. She is not diaphoretic.  HENT:     Head: Normocephalic and atraumatic.     Right Ear: External ear normal.     Left Ear: External ear normal.     Nose: Nose normal.  Eyes:     General: No scleral icterus.    Conjunctiva/sclera: Conjunctivae normal.  Neck:     Trachea: Phonation normal.  Cardiovascular:     Rate and Rhythm: Normal rate and regular rhythm.  Pulmonary:     Effort: Pulmonary effort is normal. No respiratory distress.     Breath sounds: No stridor.  Abdominal:      General: There is no distension.     Tenderness: There is abdominal tenderness (mild discomfort) in the suprapubic area. There is no guarding or rebound.  Musculoskeletal:        General: Normal range of motion.     Cervical back: Normal range of motion.  Neurological:     Mental Status: She is alert and oriented to person, place, and time.  Psychiatric:        Behavior: Behavior normal.     ED Results and Treatments Labs (all labs ordered are listed, but only abnormal results are displayed) Labs Reviewed  COMPREHENSIVE METABOLIC PANEL - Abnormal; Notable for the following components:      Result Value   Glucose, Bld 113 (*)    Calcium 8.7 (*)    All other components within normal limits  CBC - Abnormal; Notable for the following components:   WBC 16.6 (*)    All other components within normal limits  URINALYSIS, ROUTINE W REFLEX MICROSCOPIC - Abnormal; Notable for the following components:   APPearance HAZY (*)    Hgb urine dipstick LARGE (*)    Protein, ur 30 (*)    Leukocytes,Ua MODERATE (*)    Bacteria, UA FEW (*)    All other components within normal limits  LIPASE, BLOOD  PREGNANCY, URINE                                                                                                                         EKG  EKG Interpretation  Date/Time:    Ventricular Rate:    PR Interval:    QRS Duration:   QT Interval:    QTC Calculation:   R Axis:     Text Interpretation:         Radiology No results found.  Medications Ordered in ED Medications  cephALEXin (KEFLEX) capsule  500 mg (500 mg Oral Given 06/23/22 2348)                                                                                                                                     Procedures Procedures  (including critical care time)  Medical Decision Making / ED Course  Click here for ABCD2, HEART and other calculators  Medical Decision Making Amount and/or Complexity of Data Reviewed Labs:  ordered.  Risk Prescription drug management.    This patient presents to the ED for concern of lower abd cramping, this involves an extensive number of treatment options, and is a complaint that carries with it a high risk of complications and morbidity. The differential diagnosis includes but not limited to pregnancy related process, UTI. Exam less suspicious for appendicitis or other intra-abdominal inflammatory/infectious process; torsion; renal colic.  Work up: [employer] and/or imaging independently interpreted by me) CBC with leukocytosis.  No anemia. Metabolic panel without significant electrolyte derangements or renal insufficiency. No evidence of bili obstruction or pancreatitis UPT negative UA concerning for infection  Reassessment: Patient is tolerating p.o. and otherwise well-appearing Will treat for UTI. Given first dose of antibiotics in the emergency department      Final Clinical Impression(s) / ED Diagnoses Final diagnoses:  Lower urinary tract infectious disease   The patient appears reasonably screened and/or stabilized for discharge and I doubt any other medical condition or other [facility] requiring further screening, evaluation, or treatment in the ED at this time. I have discussed the findings, Dx and Tx plan with the patient/family who expressed understanding and agree(s) with the plan. Discharge instructions discussed at length. The patient/family was given strict return precautions who verbalized understanding of the instructions. No further questions at time of discharge.  Disposition: Discharge  Condition: Good  ED Discharge Orders          Ordered    cephALEXin (KEFLEX) 500 MG capsule  3 times daily        06/23/22 2336    ondansetron (ZOFRAN-ODT) 4 MG disintegrating tablet  Every 8 hours PRN        06/23/22 2336             Follow Up: Alyson Reedy, FNP [address] [place] [address] [phone]  Call  to schedule an  appointment for close follow up            This chart was dictated using voice recognition software.  Despite best efforts to proofread,  errors can occur which can change the documentation meaning.    Nira Conn, MD 06/23/22 2356

## 2022-06-23 NOTE — Telephone Encounter (Signed)
Please send alternative rx recommendation or let me know if a PA is needed

## 2022-06-23 NOTE — ED Triage Notes (Signed)
Pt states that she she has been having lower abd craming that has been going on since Friday with some nausea.

## 2022-06-23 NOTE — ED Notes (Signed)
Labeled urine specimen and culture sent to lab. [facility] 

## 2022-06-26 ENCOUNTER — Other Ambulatory Visit: Payer: Self-pay

## 2022-06-26 MED ORDER — CALCIPOTRIENE 0.005 % EX CREA: TOPICAL_CREAM | Freq: Two times a day (BID) | CUTANEOUS | 0 refills | Status: DC

## 2022-06-27 ENCOUNTER — Ambulatory Visit: Payer: Medicaid Other | Admitting: Occupational Therapy

## 2022-07-11 ENCOUNTER — Ambulatory Visit: Payer: Medicaid Other | Attending: Family Medicine | Admitting: Occupational Therapy

## 2022-07-18 ENCOUNTER — Ambulatory Visit: Payer: Medicaid Other | Admitting: Occupational Therapy

## 2022-07-23 ENCOUNTER — Encounter: Payer: Self-pay | Admitting: Dermatology

## 2022-08-05 ENCOUNTER — Encounter: Payer: Self-pay | Admitting: Dermatology

## 2022-08-05 ENCOUNTER — Ambulatory Visit (INDEPENDENT_AMBULATORY_CARE_PROVIDER_SITE_OTHER): Payer: Medicaid Other | Admitting: Dermatology

## 2022-08-05 VITALS — BP 110/78

## 2022-08-05 DIAGNOSIS — L810 Postinflammatory hyperpigmentation: Secondary | ICD-10-CM | POA: Diagnosis not present

## 2022-08-05 DIAGNOSIS — L920 Granuloma annulare: Secondary | ICD-10-CM | POA: Diagnosis not present

## 2022-08-05 DIAGNOSIS — L700 Acne vulgaris: Secondary | ICD-10-CM

## 2022-08-05 DIAGNOSIS — L219 Seborrheic dermatitis, unspecified: Secondary | ICD-10-CM

## 2022-08-05 MED ORDER — TRETINOIN 0.025 % EX CREA: TOPICAL_CREAM | Freq: Every day | CUTANEOUS | 2 refills | Status: AC

## 2022-08-05 MED ORDER — HYDROQUINONE 4 % EX CREA: TOPICAL_CREAM | Freq: Every evening | CUTANEOUS | 1 refills | Status: AC

## 2022-08-05 NOTE — Patient Instructions (Addendum)
Recommended Sunscreen                                                                     Daily Skincare Regimen: Patient Handout  Morning Routine:  Cleanse: Start your day by washing your face with a gentle cleanser. Choose a product suitable for your skin type, such as CeraVe, Neutrogena, Cetaphil, or LaRoche-Posay. Gently massage the cleanser onto damp skin, then rinse thoroughly with lukewarm water and pat dry with a clean towel.   Moisturize: Finish your morning routine by applying a moisturizer to your face and neck. Opt for a moisturizer that has SPF included, is suitable for your skin type and provides hydration without clogging pores. Consider brands like CeraVe, Neutrogena, Cetaphil, or LaRoche-Posay for effective hydration and skin barrier support.  Evening Routine:  Cleanse: Before bed, cleanse your face again with a gentle cleanser to remove makeup, dirt, and impurities accumulated throughout the day. Use the same cleanser as in the morning and follow the same steps for cleansing.  Apply Medication: Ensure that your skin is completely dry before applying any topical treatments to maximize their efficacy.  Apply prescription medication Tretinoin cream every other night    Moisturize: After applying any medications, moisturize your skin to seal in hydration and support skin barrier function. Choose a moisturizer suitable for nighttime use that helps replenish moisture overnight. Look for products from trusted brands like CeraVe, Neutrogena, Cetaphil, or LaRoche-Posay for optimal results.   Additional Tips:  Sun Protection: During the daytime, it is essential to apply a broad-spectrum sunscreen with an SPF of 30 or higher to protect your skin from harmful UV rays. Apply sunscreen as the last step of your morning skincare routine and reapply throughout the day as needed, especially if you will be spending time outdoors.  Hydration: Drink plenty of water throughout the day to  keep your skin hydrated from within.  Healthy Lifestyle: Maintain a balanced diet, get regular exercise, manage stress, and prioritize adequate sleep to support overall skin health.   Following a consistent daily skincare regimen can help promote healthy, radiant skin and minimize the risk of common skin issues. Be patient and consistent with your routine, and remember to listen to your skin's needs   Due to recent changes in healthcare laws, you may see results of your pathology and/or laboratory studies on MyChart before the doctors have had a chance to review them. We understand that in some cases there may be results that are confusing or concerning to you. Please understand that not all results are received at the same time and often the doctors may need to interpret multiple results in order to provide you with the best plan of care or course of treatment. Therefore, we ask that you please give [place] 2 business days to thoroughly review all your results before contacting the office for clarification. Should we see a critical lab result, you will be contacted sooner.   If You Need Anything After Your Visit  If you have any questions or concerns for your doctor, please call our main line at [phone] If no one answers, please leave a voicemail as directed and we will return your call as soon as possible. Messages left after 4 pm  will be answered the following business day.   You may also send [place] a message via MyChart. We typically respond to MyChart messages within 1-2 business days.  For prescription refills, please ask your pharmacy to contact our office. Our fax number is [phone].  If you have an urgent issue when the clinic is closed that cannot wait until the next business day, you can page your doctor at the number below.    Please note that while we do our best to be available for urgent issues outside of office hours, we are not available 24/7.   If you have an urgent issue and  are unable to reach [place], you may choose to seek medical care at your doctor's office, retail clinic, urgent care center, or emergency room.  If you have a medical emergency, please immediately call 911 or go to the emergency department. In the event of inclement weather, please call our main line at [phone] for an update on the status of any delays or closures.  Dermatology Medication Tips: Please keep the boxes that topical medications come in in order to help keep track of the instructions about where and how to use these. Pharmacies typically print the medication instructions only on the boxes and not directly on the medication tubes.   If your medication is too expensive, please contact our office at [phone] or send [place] a message through MyChart.   We are unable to tell what your co-pay for medications will be in advance as this is different depending on your insurance coverage. However, we may be able to find a substitute medication at lower cost or fill out paperwork to get insurance to cover a needed medication.   If a prior authorization is required to get your medication covered by your insurance company, please allow [place] 1-2 business days to complete this process.  Drug prices often vary depending on where the prescription is filled and some pharmacies may offer cheaper prices.  The website www.goodrx.com contains coupons for medications through different pharmacies. The prices here do not account for what the cost may be with help from insurance (it may be cheaper with your insurance), but the website can give you the price if you did not use any insurance.  - You can print the associated coupon and take it with your prescription to the pharmacy.  - You may also stop by our office during regular business hours and pick up a GoodRx coupon card.  - If you need your prescription sent electronically to a different pharmacy, notify our office through [facility] or by phone at  [phone]

## 2022-08-05 NOTE — Progress Notes (Signed)
   Follow-Up Visit   Subjective  Gabriela Jenkins is a 22 y.o. female who presents for the following: Seborrheic Dermatitis on the scalp. She is using DHS Zinc shampoo and Clobetasol solution. Previously it was a 7 on itch scale and now it is a 1.  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 It is not itchy. She is using Calcipotriene cream in place of Tacrolimus.   The following portions of the chart were reviewed this encounter and updated as appropriate: medications, allergies, medical history  Review of Systems:  No other skin or systemic complaints except as noted in HPI or Assessment and Plan.  Objective  Well appearing patient in no apparent distress; mood and affect are within normal limits.   A focused examination was performed of the following areas: Scalp, ankles  Relevant exam findings are noted in the Assessment and Plan.    Assessment & Plan   Seborrheic Dermatitis Exam: Appears clear today  Treatment Plan:  She will continue Clobetasol solution and DHS Zinc shampoo as needed. 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   Start Clobetasol ointment bid to affected areas of feet and ankles x 2 weeks. Advised patient to use 2 weeks on and 2 weeks off. On the off weeks, continue calcipotriene ointment twice daily to affected areas of feet and ankles   Postinflammatory hyperpigmentation (In areas of Granuloma Annulare at ankles) -Reassurance -SPF daily to prevent worsening  Treatment Plan: Hydroquione 4% cream to dark areas every night        ACNE VULGARIS Exam: Open comedones and inflammatory papules on the face  Flared  Counseling I counseled the patient regarding the following: Skin care: I discussed with the patient the impo/rtance  of using cleansers, moisturizers and cosmetics that are non-comedogenic. Expectations: The patient is aware that it may take up to 2-3 months to see a 60-80% improvement of acne. Contact office if: Acne worsens or fails to improve despite months of treatment; patient develops new scars, significantly more nodules or cysts. I recommended the following:   Medication Counseling Retinoid Counseling: Patient advised to apply a pea-sized amount only at bedtime and wait 30 minutes after washing their face before applying. If too drying, patient may add a non-comedogenic moisturizer. The patient verbalized understanding of the proper use and possible adverse effects of retinoids. All of the patient's questions and concerns were addressed.   Treatment Plan: -Tretinoin .025% cream every other night until well tolerated            No follow-ups on file.  Jaclynn Guarneri, CMA, am acting as scribe for [employer], DO.   Documentation: I have reviewed the above documentation for accuracy and completeness, and I agree with the above.  [name], DO

## 2022-08-08 ENCOUNTER — Encounter: Payer: Medicaid Other | Admitting: Occupational Therapy

## 2022-08-19 ENCOUNTER — Telehealth (HOSPITAL_BASED_OUTPATIENT_CLINIC_OR_DEPARTMENT_OTHER): Payer: Self-pay | Admitting: Family Medicine

## 2022-08-25 NOTE — Telephone Encounter (Signed)
Billing to reprocess claim

## 2022-10-30 ENCOUNTER — Ambulatory Visit: Payer: Medicaid Other | Admitting: Family Medicine

## 2022-11-11 ENCOUNTER — Encounter: Payer: Self-pay | Admitting: Dermatology

## 2022-11-11 ENCOUNTER — Ambulatory Visit (INDEPENDENT_AMBULATORY_CARE_PROVIDER_SITE_OTHER): Payer: Medicaid Other | Admitting: Dermatology

## 2022-11-11 DIAGNOSIS — L810 Postinflammatory hyperpigmentation: Secondary | ICD-10-CM | POA: Diagnosis not present

## 2022-11-11 DIAGNOSIS — L700 Acne vulgaris: Secondary | ICD-10-CM

## 2022-11-11 DIAGNOSIS — L920 Granuloma annulare: Secondary | ICD-10-CM

## 2022-11-11 MED ORDER — CLOBETASOL PROPIONATE 0.05 % EX SOLN: 1.0000 | Freq: Every day | CUTANEOUS | 1 refills | Status: AC

## 2022-11-11 MED ORDER — CALCIPOTRIENE 0.005 % EX CREA: TOPICAL_CREAM | Freq: Two times a day (BID) | CUTANEOUS | 0 refills | Status: AC

## 2022-11-11 MED ORDER — SAFETY SEAL MISCELLANEOUS MISC: 1.0000 | Freq: Every day | 3 refills | Status: AC

## 2022-11-11 NOTE — Patient Instructions (Signed)
Hello [place]a,  Thank you for visiting my office today. Your dedication to enhancing your skin health is greatly appreciated. Below is a summary of our discussion and the outlined treatment plan:  - Tretinoin 0.025%:   - Frequency: Use on Monday, Wednesday, and Friday nights.   - Application: Apply a pea-sized amount gently on the skin, followed by a moisturizer.  - Clobetasol and Calcipotriene:   - Usage: Continue alternating these medications for your granuloma annulare, switching between them every two weeks.  - Hydroquinone:   - Instruction: Temporarily discontinue use to avoid the risk of permanent darkening. We will transition to a new lightening cream in its place.  - New Lightening Cream (Melixamic):   - Composition: Contains 7% transaminic acid, 6% kojic acid, vitamin C, a small amount of tretinoin, hydrocortisone, and hyaluronic acid.   - Duration: Use nightly for three months, followed by a three-month break.   - Application: Apply at night, after using clobetasol or calcipotriene, directly onto the spots.  - Sun Protection:   - Instruction: Continue applying facial sunscreen diligently. Extend its use to other exposed areas, particularly when wearing open footwear.  - Refills:   - Details: Refills have been provided for both clobetasol and calcipotriene.  Please adhere to the instructions carefully and reach out to the office with any questions or concerns. We are looking forward to your next appointment to evaluate your progress and adjust your treatment plan as needed.  Warm regards,  Dr. [name] Dermatology   Important Information  Due to recent changes in healthcare laws, you may see results of your pathology and/or laboratory studies on MyChart before the doctors have had a chance to review them. We understand that in some cases there may be results that are confusing or concerning to you. Please understand that not all results are received at the same time  and often the doctors may need to interpret multiple results in order to provide you with the best plan of care or course of treatment. Therefore, we ask that you please give [place] 2 business days to thoroughly review all your results before contacting the office for clarification. Should we see a critical lab result, you will be contacted sooner.   If You Need Anything After Your Visit  If you have any questions or concerns for your doctor, please call our main line at [phone] If no one answers, please leave a voicemail as directed and we will return your call as soon as possible. Messages left after 4 pm will be answered the following business day.   You may also send [place] a message via MyChart. We typically respond to MyChart messages within 1-2 business days.  For prescription refills, please ask your pharmacy to contact our office. Our fax number is [phone].  If you have an urgent issue when the clinic is closed that cannot wait until the next business day, you can page your doctor at the number below.    Please note that while we do our best to be available for urgent issues outside of office hours, we are not available 24/7.   If you have an urgent issue and are unable to reach [place], you may choose to seek medical care at your doctor's office, retail clinic, urgent care center, or emergency room.  If you have a medical emergency, please immediately call 911 or go to the emergency department. In the event of inclement weather, please call our main line at [phone] for an update  on the status of any delays or closures.  Dermatology Medication Tips: Please keep the boxes that topical medications come in in order to help keep track of the instructions about where and how to use these. Pharmacies typically print the medication instructions only on the boxes and not directly on the medication tubes.   If your medication is too expensive, please contact our office at [phone] or  send [place] a message through MyChart.   We are unable to tell what your co-pay for medications will be in advance as this is different depending on your insurance coverage. However, we may be able to find a substitute medication at lower cost or fill out paperwork to get insurance to cover a needed medication.   If a prior authorization is required to get your medication covered by your insurance company, please allow [place] 1-2 business days to complete this process.  Drug prices often vary depending on where the prescription is filled and some pharmacies may offer cheaper prices.  The website www.goodrx.com contains coupons for medications through different pharmacies. The prices here do not account for what the cost may be with help from insurance (it may be cheaper with your insurance), but the website can give you the price if you did not use any insurance.  - You can print the associated coupon and take it with your prescription to the pharmacy.  - You may also stop by our office during regular business hours and pick up a GoodRx coupon card.  - If you need your prescription sent electronically to a different pharmacy, notify our office through [facility] or by phone at [phone]

## 2022-11-11 NOTE — Progress Notes (Signed)
   Follow-Up Visit   Subjective  Gabriela Jenkins is a 22 y.o. female who presents for the following: 3 month acne follow up, patient using tretinoin 0.025% as needed for flares. She is not using every night. Acne is improved.   GA follow up at ankles, using calcipotriene and alternating every 2 weeks with clobetasol. Also using hydroquinone 4% for PIPA.  Did fade but has started to darken up again. She does have itching at left foot.   The patient has spots, moles and lesions to be evaluated, some may be new or changing and the patient may have concern these could be cancer.   The following portions of the chart were reviewed this encounter and updated as appropriate: medications, allergies, medical history  Review of Systems:  No other skin or systemic complaints except as noted in HPI or Assessment and Plan.  Objective  Well appearing patient in no apparent distress; mood and affect are within normal limits.   A focused examination was performed of the following areas: Face, legs, feet and ankles  Relevant exam findings are noted in the Assessment and Plan.    Assessment & Plan   ACNE VULGARIS Exam: Open comedones and inflammatory papules   Treatment Plan: Continue tretinoin 0.025% cream at 3 times weekly at bedtime as tolerated  Granuloma Annulare  Exam:   Treatment Plan: continue Clobetasol ointment bid to affected areas of feet and ankles x 2 weeks. Advised patient to use 2 weeks on and 2 weeks off. On the off weeks, continue calcipotriene ointment twice daily to affected areas of feet and ankles  Postinflammatory hyperpigmentation (In areas of Granuloma Annulare at ankles) -Reassurance -SPF daily to prevent worsening  Treatment Plan: Discontinue hydroquinone  Start MedRock melaxemic cream tranexamic 7%, kojic acid 6% #15gm,  3 Rf   Return in about 3 months (around 02/10/2023) for acne, GA.  Anise Salvo, RMA, am acting as scribe for [employer],  DO .   Documentation: I have reviewed the above documentation for accuracy and completeness, and I agree with the above.  [name], DO

## 2022-12-12 ENCOUNTER — Encounter: Payer: Self-pay | Admitting: Certified Nurse Midwife

## 2022-12-12 ENCOUNTER — Ambulatory Visit: Payer: Medicaid Other | Admitting: Certified Nurse Midwife

## 2022-12-12 ENCOUNTER — Other Ambulatory Visit (HOSPITAL_COMMUNITY)
Admission: RE | Admit: 2022-12-12 | Discharge: 2022-12-12 | Disposition: A | Discharge status: Home / Self Care | Payer: Medicaid Other | Source: Ambulatory Visit | Attending: Family Medicine | Admitting: Family Medicine

## 2022-12-12 VITALS — BP 111/75 | HR 81 | Ht 60.0 in | Wt 141.3 lb

## 2022-12-12 DIAGNOSIS — Z113 Encounter for screening for infections with a predominantly sexual mode of transmission: Secondary | ICD-10-CM | POA: Diagnosis present

## 2022-12-12 DIAGNOSIS — Z3009 Encounter for other general counseling and advice on contraception: Secondary | ICD-10-CM

## 2022-12-12 DIAGNOSIS — Z30011 Encounter for initial prescription of contraceptive pills: Secondary | ICD-10-CM

## 2022-12-12 DIAGNOSIS — Z01419 Encounter for gynecological examination (general) (routine) without abnormal findings: Secondary | ICD-10-CM

## 2022-12-12 DIAGNOSIS — N838 Other noninflammatory disorders of ovary, fallopian tube and broad ligament: Secondary | ICD-10-CM

## 2022-12-12 MED ORDER — NORETHINDRONE 0.35 MG PO TABS: 1.0000 | ORAL_TABLET | Freq: Every day | ORAL | 11 refills | Status: AC

## 2022-12-12 MED ORDER — SLYND 4 MG PO TABS: 1.0000 | ORAL_TABLET | Freq: Every day | ORAL | 4 refills | Status: DC

## 2022-12-12 NOTE — Progress Notes (Unsigned)
Pt. Presents for annual. Is concerned about enlarged ovaries.

## 2022-12-15 ENCOUNTER — Encounter (HOSPITAL_BASED_OUTPATIENT_CLINIC_OR_DEPARTMENT_OTHER): Payer: Medicaid Other | Admitting: Family Medicine

## 2022-12-15 LAB — CERVICOVAGINAL ANCILLARY ONLY
Bacterial Vaginitis (gardnerella): NEGATIVE
Candida Glabrata: NEGATIVE
Candida Vaginitis: NEGATIVE
Chlamydia: NEGATIVE
Comment: NEGATIVE
Comment: NEGATIVE
Comment: NEGATIVE
Comment: NEGATIVE
Comment: NEGATIVE
Comment: NORMAL
Neisseria Gonorrhea: NEGATIVE
Trichomonas: NEGATIVE

## 2022-12-16 DIAGNOSIS — Z309 Encounter for contraceptive management, unspecified: Secondary | ICD-10-CM | POA: Insufficient documentation

## 2022-12-16 LAB — CYTOLOGY - PAP: Diagnosis: NEGATIVE

## 2022-12-16 NOTE — Progress Notes (Signed)
GYNECOLOGY OFFICE VISIT NOTE  History:   Gabriela Jenkins is a 22 y.o. G0P0000 here today for annual exam.  She reports normal 20-25 day cycles with a 4-5 day bleed. She reports that she is sexually active with a female partner. She denies complaints, but was told that she had enlarged ovaries and is worried. She also is interested in knowing more about contraception and options that would be best for her. She She denies any abnormal vaginal discharge, bleeding, pelvic pain or other concerns.    Past Medical History:  Diagnosis Date   Hyperlipidemia     Past Surgical History:  Procedure Laterality Date   POLYDACTYLY RECONSTRUCTION      The following portions of the patient's history were reviewed and updated as appropriate: allergies, current medications, past family history, past medical history, past social history, past surgical history and problem list.   Health Maintenance: This is patients first pap smear to be collected today.   Review of Systems:  Pertinent items noted in HPI and remainder of comprehensive ROS otherwise negative.  Physical Exam:  BP 111/75   Pulse 81   Ht 5' (1.524 m)   Wt 141 lb 4.8 oz (64.1 kg)   LMP 11/14/2022 (Exact Date)   BMI 27.60 kg/m  CONSTITUTIONAL: Well-developed, well-nourished female in no acute distress.  HEENT:  Normocephalic, atraumatic. External right and left ear normal. No scleral icterus.  NECK: Normal range of motion, supple, no masses noted on observation SKIN: No rash noted. Not diaphoretic. No erythema. No pallor. MUSCULOSKELETAL: Normal range of motion. No edema noted. NEUROLOGIC: Alert and oriented to person, place, and time. Normal muscle tone coordination. No cranial nerve deficit noted. PSYCHIATRIC: Normal mood and affect. Normal behavior. Normal judgment and thought content. CARDIOVASCULAR: Normal heart rate noted RESPIRATORY: Effort and breath sounds normal, no problems with respiration noted ABDOMEN: No masses  noted. No other overt distention noted.   PELVIC: Normal appearing external genitalia; normal urethral meatus; normal appearing vaginal mucosa and cervix.  No abnormal discharge noted.  Normal uterine size, no other palpable masses, no uterine or adnexal tenderness. Performed in the presence of a chaperone  Labs and Imaging Results for orders placed or performed in visit on 12/12/22 (from the past 168 hour(s))  Cervicovaginal ancillary only( [place])   Collection Time: 12/12/22 11:54 AM  Result Value Ref Range   Neisseria Gonorrhea Negative    Chlamydia Negative    Trichomonas Negative    Bacterial Vaginitis (gardnerella) Negative    Candida Vaginitis Negative    Candida Glabrata Negative    Comment      Normal Reference Range Bacterial Vaginosis - Negative   Comment Normal Reference Range Candida Species - Negative    Comment Normal Reference Range Candida Galbrata - Negative    Comment Normal Reference Range Trichomonas - Negative    Comment Normal Reference [place] Chlamydia - Negative    Comment      Normal Reference Range Neisseria Gonorrhea - Negative   No results found.    Assessment and Plan:    1. Encounter for annual routine gynecological examination - Patient overall doing well.  - Cytology - PAP( [place])  2. Encounter for cervical Pap smear with pelvic exam - Reviewed turn around time for test to result.  - Cytology - PAP( [place])  3. Birth control counseling - Reviewed all methods and patient is considering the use of Slynd for contraception.  - Rx sent to outpatient pharmacy for  pick up.   4. Screening examination for STD (sexually transmitted disease) - Patient declined testing.  - Cytology - PAP( [place]) - Cervicovaginal ancillary only( [place])  5. Enlarged ovary - Normal to palpation and reassurance provided.  - Patient request a repeat US to check the size of her ovaries. Orders placed.  - US PELVIS TRANSVAGINAL NON-OB ([place] ONLY);  Future   Routine preventative health maintenance measures emphasized. Please refer to After Visit Summary for other counseling recommendations.   Return in about 1 year (around 12/12/2023) for [place].    I spent 30 minutes dedicated to the care of this patient including pre-visit review of records, face to face time with the patient discussing her conditions and treatments and post visit orders.    Kiyana Vazguez Danella Deis) Suzie Portela, MSN, CNM  [facility]  12/16/22 3:37 AM

## 2022-12-19 ENCOUNTER — Ambulatory Visit (HOSPITAL_COMMUNITY)
Admission: RE | Admit: 2022-12-19 | Discharge: 2022-12-19 | Disposition: A | Discharge status: Home / Self Care | Payer: Medicaid Other | Source: Ambulatory Visit | Attending: Certified Nurse Midwife | Admitting: Certified Nurse Midwife

## 2022-12-19 DIAGNOSIS — N838 Other noninflammatory disorders of ovary, fallopian tube and broad ligament: Secondary | ICD-10-CM

## 2022-12-20 ENCOUNTER — Encounter: Payer: Self-pay | Admitting: Certified Nurse Midwife

## 2023-01-29 ENCOUNTER — Encounter (HOSPITAL_BASED_OUTPATIENT_CLINIC_OR_DEPARTMENT_OTHER): Payer: Self-pay | Admitting: Family Medicine

## 2023-02-24 ENCOUNTER — Other Ambulatory Visit: Payer: Self-pay | Admitting: Dermatology

## 2023-02-24 ENCOUNTER — Ambulatory Visit: Payer: Medicaid Other | Admitting: Dermatology

## 2023-02-24 ENCOUNTER — Encounter: Payer: Self-pay | Admitting: Dermatology

## 2023-02-24 DIAGNOSIS — L920 Granuloma annulare: Secondary | ICD-10-CM | POA: Diagnosis not present

## 2023-02-24 DIAGNOSIS — L700 Acne vulgaris: Secondary | ICD-10-CM | POA: Diagnosis not present

## 2023-02-24 MED ORDER — SPIRONOLACTONE 100 MG PO TABS
100.0000 mg | ORAL_TABLET | Freq: Every day | ORAL | 2 refills | Status: AC
Start: 2023-02-24 — End: ?

## 2023-02-24 MED ORDER — CLINDAMYCIN PHOSPHATE 1 % EX SWAB
1.0000 | Freq: Every day | CUTANEOUS | 2 refills | Status: DC
Start: 2023-02-24 — End: 2023-02-26

## 2023-02-24 NOTE — Patient Instructions (Addendum)
Hello [place],  Thank you for visiting my office today. Your dedication to enhancing your skin health is greatly appreciated. Below is a summary of the essential instructions from our discussion:  - Morning Routine:   - Begin by washing your face, followed by the application of clindamycin swabs.   - Continue with your usual serums and moisturizers.   - Apply hyaluronic acid, then your moisturizer.  - Evening Routine:   - Wash your face, apply hyaluronic acid, then use tretinoin (limited to three nights a week).   - Follow tretinoin with a light moisturizer, such as La Roche-Posay's Toleriane Double Repair.   - On weekends, substitute tretinoin with a salicylic acid wash for both morning and evening routines.  - Medications:   - Continue applying tretinoin use three nights a week.   - Initiate spironolactone once daily, being vigilant for any side effects like lightheadedness or dizziness.   - For granuloma annulare, apply clobetasol twice daily for up to two weeks as needed for itchiness. Discontinue earlier if itchiness resolves within three days.  - Additional Recommendations:   - Employ a silicone scrubber for gentle mechanical exfoliation.   - Avoid using heavy creams such as Eucerin as your night moisturizer.   - Manage stress, as it can adversely affect skin conditions.   - Ensure your feet remain hydrated and consider using Lightning Creams for hyperpigmentation as necessary.  - Follow-Up:   - We will schedule a follow-up appointment in four months to assess your progress and possibly introduce peeling pads with alpha and beta hydroxy acids into your regimen.  Please do not hesitate to contact our office if you have any questions or concerns regarding these instructions. I am looking forward to observing the positive changes in your skin at our next meeting.  Warm regards,  Dr. [name],  Dermatology      Important Information  Due to recent changes in healthcare  laws, you may see results of your pathology and/or laboratory studies on MyChart before the doctors have had a chance to review them. We understand that in some cases there may be results that are confusing or concerning to you. Please understand that not all results are received at the same time and often the doctors may need to interpret multiple results in order to provide you with the best plan of care or course of treatment. Therefore, we ask that you please give [place] 2 business days to thoroughly review all your results before contacting the office for clarification. Should we see a critical lab result, you will be contacted sooner.   If You Need Anything After Your Visit  If you have any questions or concerns for your doctor, please call our main line at [phone] If no one answers, please leave a voicemail as directed and we will return your call as soon as possible. Messages left after 4 pm will be answered the following business day.   You may also send [place] a message via MyChart. We typically respond to MyChart messages within 1-2 business days.  For prescription refills, please ask your pharmacy to contact our office. Our fax number is [phone].  If you have an urgent issue when the clinic is closed that cannot wait until the next business day, you can page your doctor at the number below.    Please note that while we do our best to be available for urgent issues outside of office hours, we are not available 24/7.   If  you have an urgent issue and are unable to reach [place], you may choose to seek medical care at your doctor's office, retail clinic, urgent care center, or emergency room.  If you have a medical emergency, please immediately call 911 or go to the emergency department. In the event of inclement weather, please call our main line at [phone] for an update on the status of any delays or closures.  Dermatology Medication Tips: Please keep the boxes that topical  medications come in in order to help keep track of the instructions about where and how to use these. Pharmacies typically print the medication instructions only on the boxes and not directly on the medication tubes.   If your medication is too expensive, please contact our office at [phone] or send [place] a message through MyChart.   We are unable to tell what your co-pay for medications will be in advance as this is different depending on your insurance coverage. However, we may be able to find a substitute medication at lower cost or fill out paperwork to get insurance to cover a needed medication.   If a prior authorization is required to get your medication covered by your insurance company, please allow [place] 1-2 business days to complete this process.  Drug prices often vary depending on where the prescription is filled and some pharmacies may offer cheaper prices.  The website www.goodrx.com contains coupons for medications through different pharmacies. The prices here do not account for what the cost may be with help from insurance (it may be cheaper with your insurance), but the website can give you the price if you did not use any insurance.  - You can print the associated coupon and take it with your prescription to the pharmacy.  - You may also stop by our office during regular business hours and pick up a GoodRx coupon card.  - If you need your prescription sent electronically to a different pharmacy, notify our office through [facility] or by phone at [phone]

## 2023-02-24 NOTE — Progress Notes (Signed)
   Follow-Up Visit   Subjective  Gabriela Jenkins is a 22 y.o. female who presents for the following:  ACNE VULGARIS and  Granuloma Annulare   Gabriela Jenkins presents with a chief complaint of acne. She reports using tretinoin three nights a week and experiencing dry patches and irritation around her eyes. She has been using Eryxema to alleviate the irritation. The patient also mentions having large pores and a lot of texture on her skin.  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 She reports that the GA improved after stopping the use of topical treatments, with the lesions becoming lighter and flattened. She has made lifestyle changes to manage stress, which she believes has contributed to the improvement of her GA. However, she still experiences itching and uses clobetasol to alleviate the itchiness. The patient has had GA since she was 22 years old, and it has never completely faded, with periods of flare-ups and remissions.  The following portions of the chart were reviewed this encounter and updated as appropriate: medications, allergies, medical history  Review of Systems:  No other skin or systemic complaints except as noted in HPI or Assessment and Plan.  Objective  Well appearing patient in no apparent distress; mood and affect are within normal limits.   A focused examination was performed of the following areas: face, feet & legs   Relevant exam findings are noted in the Assessment and Plan.                     Assessment & Plan   1. Acne Vulgaris - Assessment: Patient using tretinoin three nights a week with noted improvement. Experiences flare-ups around the eyes. . Reports oily skin, enlarged pores, and textural concerns, though skin barrier remains intact. - Plan:   - Continue tretinoin three nights a week, avoiding eyes and lips.   - Add clindamycin swabs in the morning post face wash, serums and moisturizers.    - Initiate spironolactone once daily, with monitoring for side effects such as lightheadedness and dizziness.   - Incorporate salicylic acid wash (CeraVe) on weekends, off tretinoin days.   - Use silicone scrubbers for mechanical exfoliation.   - Apply hyaluronic acid (The Ordinary) morning and night after washing face.   - Switch to a lighter moisturizer, recommend La Roche-Posay Toleriane Double Repair.   - Follow-up in 4 months to evaluate and possibly introduce alpha and beta hydroxy acid peeling pads.  2. Granuloma Annulare - Assessment: Patient reports improvement in lesions after discontinuing topical treatments, especially on the right foot. Condition stable with hyperpigmented patches, no elevated borders, and intermittent itching. Chronic condition with onset at age 70. - Plan:   - Continue moisturizing, especially feet.   - Use clobetasol for itching, twice daily for up to 2 weeks (discontinue if itch resolves sooner).   - Apply lightning creams for hyperpigmentation on face.   - Consider niacinamide serums for hydration and pigment fading.   - Monitor for signs of reactivation, such as itching or elevation of lesions.   - Follow up if condition reactivates or does not improve over time. ACNE VULGARIS    Return in about 4 months (around 06/25/2023) for acne & GA.    Documentation: I have reviewed the above documentation for accuracy and completeness, and I agree with the above.   I, Shirron Marcha Solders, CMA, am acting as scribe for [employer], DO.   [name], DO

## 2023-02-26 NOTE — Telephone Encounter (Signed)
We can change to Clindamycin 1% lotion.  Thanks!

## 2023-02-26 NOTE — Telephone Encounter (Signed)
Rx has been changed

## 2023-06-30 ENCOUNTER — Ambulatory Visit: Payer: Medicaid Other | Admitting: Dermatology

## 2023-07-01 ENCOUNTER — Encounter (HOSPITAL_BASED_OUTPATIENT_CLINIC_OR_DEPARTMENT_OTHER): Payer: Self-pay

## 2023-07-02 NOTE — Telephone Encounter (Signed)
 Lvm for pt to call and schedule when ready

## 2023-08-10 ENCOUNTER — Ambulatory Visit: Admitting: Dermatology

## 2023-12-14 ENCOUNTER — Encounter: Payer: Self-pay | Admitting: Certified Nurse Midwife

## 2023-12-27 ENCOUNTER — Encounter (HOSPITAL_BASED_OUTPATIENT_CLINIC_OR_DEPARTMENT_OTHER): Payer: Self-pay

## 2023-12-27 ENCOUNTER — Emergency Department (HOSPITAL_BASED_OUTPATIENT_CLINIC_OR_DEPARTMENT_OTHER)
Admission: EM | Admit: 2023-12-27 | Discharge: 2023-12-27 | Disposition: A | Discharge status: Home / Self Care | Attending: Emergency Medicine | Admitting: Emergency Medicine

## 2023-12-27 ENCOUNTER — Other Ambulatory Visit: Payer: Self-pay

## 2023-12-27 DIAGNOSIS — T6591XA Toxic effect of unspecified substance, accidental (unintentional), initial encounter: Secondary | ICD-10-CM

## 2023-12-27 DIAGNOSIS — R42 Dizziness and giddiness: Secondary | ICD-10-CM | POA: Diagnosis present

## 2023-12-27 DIAGNOSIS — T43291A Poisoning by other antidepressants, accidental (unintentional), initial encounter: Secondary | ICD-10-CM | POA: Diagnosis present

## 2023-12-27 NOTE — ED Triage Notes (Signed)
 Pt c/o dizziness, like when I get up I get lightheaded. States she was seen earlier tonight for accidentally taking Wellbutrin  instead of ibuprofen  for HA; was cleared, discharged from triage.  Called poison control, advised to have family keep an eye on her, but if I wanted to come in, I could.

## 2023-12-27 NOTE — ED Triage Notes (Signed)
 Patient reports she accidentally took three 150 mg of Wellbutrin  XL. She meant to grab the ibuprofen  for menstrual cramps and grabbed the wrong bottle. She has a slight headache, otherwise no symptoms. Full rainbow drawn in triage.

## 2023-12-27 NOTE — Discharge Instructions (Addendum)
 Continue to be careful about your medications.

## 2023-12-27 NOTE — ED Provider Notes (Signed)
 [place] EMERGENCY DEPARTMENT AT [facility] Provider Note   CSN: [id number] Arrival date & time: 12/27/23  1824     Patient presents with: Drug Overdose (Accidental)   Gabriela Jenkins is a 23 y.o. female.   Patient here after she accidentally took 3 tablets of 150 mg Wellbutrin .  She thought these were ibuprofen .  She was taken these medications for some menstrual cramping the mild headache she is having.  She denies any weakness numbness tingling.  No nausea vomit diarrhea.  He has a history of high cholesterol.  She is not really having any major symptoms at this time.  The history is provided by the patient.       Prior to Admission medications   Medication Sig Start Date End Date Taking? Authorizing Provider  calcipotriene  (DOVONOX) 0.005 % cream Apply topically 2 (two) times daily. Apply topically two times daily for two weeks. Alternate with Clobetasol  11/11/22   Alm Delon SAILOR, DO  clindamycin  (CLEOCIN -T) 1 % lotion Apply topically every morning. 02/26/23 02/26/24  Alm Delon SAILOR, DO  clobetasol  (TEMOVATE ) 0.05 % external solution Apply 1 Application topically daily. Apply to scalp as needed for itch 11/11/22   Alm Delon SAILOR, DO  clobetasol  ointment (TEMOVATE ) 0.05 % Apply 1 Application topically 2 (two) times daily. To affected areas on feet and ankles x 2 weeks. Wait 2 weeks before restarting 06/16/22   Alm Delon SAILOR, DO  hydroquinone  4 % cream Apply topically at bedtime. Apply every night to darker areas only 08/05/22   Alm Delon SAILOR, DO  norethindrone  (MICRONOR ) 0.35 MG tablet Take 1 tablet (0.35 mg total) by mouth daily. 12/12/22   Emilio Delilah HERO, CNM  Safety Seal Miscellaneous MISC 1 Application by Does not apply route at bedtime. Melaxemic Cream Tranexamic Acid 7%, Kojic Acid 6% 11/11/22   Alm Delon SAILOR, DO  spironolactone  (ALDACTONE ) 100 MG tablet Take 1 tablet (100 mg total) by mouth daily. 02/24/23   Alm Delon SAILOR, DO     Allergies: Pollen extract    Review of Systems  Updated Vital Signs BP (!) 119/93 (BP Location: Right Arm)   Pulse 81   Temp 98 F (36.7 C)   Resp 17   SpO2 100%   Physical Exam Vitals and nursing note reviewed.  Constitutional:      General: She is not in acute distress.    Appearance: She is well-developed.  HENT:     Head: Normocephalic and atraumatic.  Eyes:     Conjunctiva/sclera: Conjunctivae normal.  Cardiovascular:     Rate and Rhythm: Normal rate and regular rhythm.     Heart sounds: No murmur heard. Pulmonary:     Effort: Pulmonary effort is normal. No respiratory distress.     Breath sounds: Normal breath sounds.  Abdominal:     Palpations: Abdomen is soft.     Tenderness: There is no abdominal tenderness.  Musculoskeletal:        General: No swelling.     Cervical back: Neck supple.  Skin:    General: Skin is warm and dry.     Capillary Refill: Capillary refill takes less than 2 seconds.  Neurological:     General: No focal deficit present.     Mental Status: She is alert and oriented to person, place, and time.     Sensory: No sensory deficit.     Motor: No weakness.  Psychiatric:        Mood and Affect: Mood normal.     (  all labs ordered are listed, but only abnormal results are displayed) Labs Reviewed - No data to display  EKG: EKG Interpretation Date/Time:  Sunday December 27 2023 18:49:22 EDT Ventricular Rate:  86 PR Interval:  136 QRS Duration:  86 QT Interval:  334 QTC Calculation: 399 R Axis:   65  Text Interpretation: Normal sinus rhythm When compared with ECG of 05-Apr-2022 21:36, PREVIOUS ECG IS PRESENT Confirmed by Ruthe Cornet [phone]) on 12/27/2023 6:51:17 PM  Radiology: No results found.   Procedures   Medications Ordered in the ED - No data to display                                  Medical Decision Making  Gabriela Jenkins is here after she accidentally took three 150 mg tablets of Wellbutrin .  She  thought she was taking ibuprofen  for menstrual cramps and headache.  Otherwise she has normal vitals.  No fever.  We talked to poison control on the phone.  EKG shows sinus rhythm.  No ischemic changes.  QTc was normal.  They did not recommend any further observation at this time she can be discharged to home.  Overall she is neurologically intact.  She is well-appearing.  Continue Tylenol  and ibuprofen  for her menstrual cramps.  Discharged in good condition.  Understands return precautions.  This chart was dictated using voice recognition software.  Despite best efforts to proofread,  errors can occur which can change the documentation meaning.      Final diagnoses:  Accidental ingestion of substance, initial encounter    ED Discharge Orders     None          Ruthe Cornet, DO 12/27/23 1905

## 2023-12-28 ENCOUNTER — Emergency Department (HOSPITAL_BASED_OUTPATIENT_CLINIC_OR_DEPARTMENT_OTHER)
Admission: EM | Admit: 2023-12-28 | Discharge: 2023-12-28 | Disposition: A | Discharge status: Home / Self Care | Source: Home / Self Care

## 2023-12-28 DIAGNOSIS — T6591XA Toxic effect of unspecified substance, accidental (unintentional), initial encounter: Secondary | ICD-10-CM

## 2023-12-28 NOTE — Discharge Instructions (Signed)
 Drink lots of fluids and eat a balanced diet.  Follow-up with your psychiatrist.

## 2023-12-28 NOTE — ED Provider Notes (Signed)
  EMERGENCY DEPARTMENT AT [facility] Provider Note   CSN: [id number] Arrival date & time: 12/27/23  2351     Patient presents with: No chief complaint on file.   Gabriela Jenkins is a 23 y.o. female.   23 year old female presents to the ER for lightheadedness.  She was seen earlier today after she took too many Wellbutrin  accidentally.  She states she thought they were ibuprofen  instead.  She was seen by provider earlier and Poison control was called.  Her EKG was within normal limits and Poison control recommended she be discharged home.  She was still feeling somewhat lightheaded and dizzy, which she states is a normal side effect that she gets after she takes Wellbutrin  but it has not gone away yet.  She denies any other symptoms or concerns.  She took the Wellbutrin  around 1 PM.        Prior to Admission medications   Medication Sig Start Date End Date Taking? Authorizing Provider  calcipotriene  (DOVONOX) 0.005 % cream Apply topically 2 (two) times daily. Apply topically two times daily for two weeks. Alternate with Clobetasol  11/11/22   Alm Delon SAILOR, DO  clindamycin  (CLEOCIN -T) 1 % lotion Apply topically every morning. 02/26/23 02/26/24  Alm Delon SAILOR, DO  clobetasol  (TEMOVATE ) 0.05 % external solution Apply 1 Application topically daily. Apply to scalp as needed for itch 11/11/22   Alm Delon SAILOR, DO  clobetasol  ointment (TEMOVATE ) 0.05 % Apply 1 Application topically 2 (two) times daily. To affected areas on feet and ankles x 2 weeks. Wait 2 weeks before restarting 06/16/22   Alm Delon SAILOR, DO  hydroquinone  4 % cream Apply topically at bedtime. Apply every night to darker areas only 08/05/22   Alm Delon SAILOR, DO  norethindrone  (MICRONOR ) 0.35 MG tablet Take 1 tablet (0.35 mg total) by mouth daily. 12/12/22   Emilio Delilah HERO, CNM  Safety Seal Miscellaneous MISC 1 Application by Does not apply route at bedtime. Melaxemic Cream Tranexamic Acid  7%, Kojic Acid 6% 11/11/22   Alm Delon SAILOR, DO  spironolactone  (ALDACTONE ) 100 MG tablet Take 1 tablet (100 mg total) by mouth daily. 02/24/23   Alm Delon SAILOR, DO    Allergies: Pollen extract    Review of Systems  Constitutional:  Positive for fatigue. Negative for chills and fever.  HENT:  Negative for ear pain and sore throat.   Eyes:  Negative for pain and visual disturbance.  Respiratory:  Negative for cough and shortness of breath.   Cardiovascular:  Negative for chest pain and palpitations.  Gastrointestinal:  Negative for abdominal pain and vomiting.  Genitourinary:  Negative for dysuria and hematuria.  Musculoskeletal:  Negative for arthralgias and back pain.  Skin:  Negative for color change and rash.  Neurological:  Positive for light-headedness. Negative for seizures and syncope.  All other systems reviewed and are negative.   Updated Vital Signs BP 129/77 (BP Location: Right Arm)   Pulse (!) 102   Temp 97.9 F (36.6 C)   Resp 17   SpO2 100%   Physical Exam Vitals and nursing note reviewed.  Constitutional:      General: She is not in acute distress.    Appearance: Normal appearance. She is well-developed. She is not ill-appearing.  HENT:     Head: Normocephalic and atraumatic.  Eyes:     Conjunctiva/sclera: Conjunctivae normal.  Cardiovascular:     Rate and Rhythm: Normal rate and regular rhythm.     Heart sounds: No  murmur heard. Pulmonary:     Effort: Pulmonary effort is normal. No respiratory distress.     Breath sounds: Normal breath sounds.  Abdominal:     Palpations: Abdomen is soft.     Tenderness: There is no abdominal tenderness.  Musculoskeletal:        General: No swelling.     Cervical back: Neck supple.  Skin:    General: Skin is warm and dry.     Capillary Refill: Capillary refill takes less than 2 seconds.  Neurological:     Mental Status: She is alert.  Psychiatric:        Mood and Affect: Mood normal.     (all labs ordered  are listed, but only abnormal results are displayed) Labs Reviewed - No data to display  EKG: None  Radiology: No results found.   Procedures   Medications Ordered in the ED - No data to display                                  Medical Decision Making Patient here for ongoing lightheadedness in the setting of her accidentally taking too many Wellbutrin .  Poison control was consulted earlier and they recommended she be discharged home.  Her EKG was within normal limits.  I had a very long discussion with her and her mother.  I told her that because she took too many Wellbutrin  she likely is going to feel her normal side effects a little longer than she usually does.  She has been able to eat and drink without difficulty is able to ambulate without difficulty.  Overall I think she is very anxious about what happened.  I told her she was cleared earlier by the other provider and poison control.  Advised to follow-up with her psychiatrist as she states she wants to stop her Wellbutrin .  Advised to return to the ER for new or worsening symptoms.  All questions were answered and she is feeling reassured at this time.  Advised follow-up with primary care doctor otherwise return to the ER for new or worsening symptoms.  Problems Addressed: Accidental ingestion of substance, initial encounter: self-limited or minor problem  Amount and/or Complexity of Data Reviewed External Data Reviewed: notes.    Details: Prior ED visit from earlier today reviewed and patient was cleared by poison control, had a normal EKG at that time and normal intervals  Risk OTC drugs. Prescription drug management.    Final diagnoses:  Accidental ingestion of substance, initial encounter    ED Discharge Orders     None          Gennaro Duwaine CROME, DO 12/28/23 [id number]

## 2024-01-07 ENCOUNTER — Ambulatory Visit: Admitting: Physician Assistant

## 2024-01-20 ENCOUNTER — Encounter: Payer: Self-pay | Admitting: Obstetrics and Gynecology

## 2024-01-20 ENCOUNTER — Ambulatory Visit: Admitting: Obstetrics and Gynecology

## 2024-01-20 VITALS — BP 117/70 | HR 78 | Ht 60.0 in | Wt 134.6 lb

## 2024-01-20 DIAGNOSIS — F419 Anxiety disorder, unspecified: Secondary | ICD-10-CM | POA: Diagnosis not present

## 2024-01-20 DIAGNOSIS — Z01419 Encounter for gynecological examination (general) (routine) without abnormal findings: Secondary | ICD-10-CM | POA: Diagnosis not present

## 2024-01-20 DIAGNOSIS — F32A Depression, unspecified: Secondary | ICD-10-CM | POA: Diagnosis not present

## 2024-01-20 DIAGNOSIS — N946 Dysmenorrhea, unspecified: Secondary | ICD-10-CM

## 2024-01-20 MED ORDER — IBUPROFEN 800 MG PO TABS
800.0000 mg | ORAL_TABLET | Freq: Three times a day (TID) | ORAL | 1 refills | Status: AC | PRN
Start: 2024-01-20 — End: ?

## 2024-01-20 NOTE — Progress Notes (Signed)
 ANNUAL EXAM Patient name: Gabriela Jenkins MRN [id number]  Date of birth: Jan 01, 2001 Chief Complaint:   Annual exam  History of Present Illness:   Gabriela Jenkins is a 23 y.o. G0P0000  female being seen today for a routine annual exam.  Current complaints: reports dysmenorrhea during cycle as well as few days before and after period. She denies pain with bowel or bladder  Does not desires birth control at this time    No LMP recorded. (Menstrual status: Irregular Periods).   Gynecologic History Contraception: n/a Last Pap: 12/12/22. Results were: NILM Last mammogram: n/a     01/20/2024   11:06 AM 05/05/2022    2:54 PM 04/09/2022    1:22 PM 03/11/2022    9:28 AM 01/28/2022    1:40 PM  Depression screen PHQ 2/9  Decreased Interest 2 0  2 3  Down, Depressed, Hopeless 2 0  2 3  PHQ - 2 Score 4 0  4 6  Altered sleeping 2 0  2 3  Tired, decreased energy 3 1  2 3   Change in appetite 3 1  1 3   Feeling bad or failure about yourself  3 0  1 3  Trouble concentrating 3 1  2 1   Moving slowly or fidgety/restless 2 0  2 0  Suicidal thoughts 0 0  2 1  PHQ-9 Score 20 3   16  20    Difficult doing work/chores  Not difficult at all  Very difficult Very difficult     Information is confidential and restricted. Go to Review Flowsheets to unlock data.   Data saved with a previous flowsheet row definition        01/20/2024   11:06 AM 05/05/2022    2:54 PM 04/09/2022    1:24 PM 03/11/2022    9:28 AM  GAD 7 : Generalized Anxiety Score  Nervous, Anxious, on Edge 3 1  3   Control/stop worrying 3 1  3   Worry too much - different things 3 1  3   Trouble relaxing 3 1  3   Restless 3 0  1  Easily annoyed or irritable 3 1  1   Afraid - awful might happen 3 1  3   Total GAD 7 Score 21 6  17   Anxiety Difficulty  Somewhat difficult  Very difficult     Information is confidential and restricted. Go to Review Flowsheets to unlock data.     Review of Systems:   Pertinent items are noted in  HPI Denies any headaches, blurred vision, fatigue, shortness of breath, chest pain, abdominal pain, abnormal vaginal discharge/itching/odor/irritation, problems with periods, bowel movements, urination, or intercourse unless otherwise stated above. Pertinent History Reviewed:  Reviewed past medical,surgical, social and family history.  Reviewed problem list, medications and allergies. Physical Assessment:   Vitals:   01/20/24 1105  BP: 117/70  Pulse: 78  Weight: 134 lb 9.6 oz (61.1 kg)  Height: 5' (1.524 m)  Body mass index is 26.29 kg/m.        Physical Examination:   General appearance - well appearing, and in no distress  Mental status - alert, oriented   Psych:  She has a normal mood and affect  Skin - warm and dry, normal color  Chest - effort normal, all lung fields clear to auscultation bilaterally  Heart - normal rate and regular rhythm  Neck:  midline trachea  Breasts - breasts appear normal, no suspicious masses, no skin or nipple changes or  axillary nodes  Abdomen - soft, nontender, nondistended  Pelvic - VULVA: normal appearing vulva with no masses, tenderness or lesions  VAGINA: normal appearing vagina with normal color and discharge, no lesions  CERVIX: normal appearing cervix without discharge or lesions, no CMT   UTERUS: uterus is felt to be normal size, shape, consistency and nontender   ADNEXA: No adnexal masses or tenderness noted.  Extremities:  No swelling or varicosities noted  Chaperone present for exam  No results found for this or any previous visit (from the past 24 hours).  Assessment & Plan:  1. Encounter for annual routine gynecological examination (Primary) UTD on pap smear  Encourage routine self breast exam, mammogram at 41  2. Dysmenorrhea Discussed different options for management, does not desire hormonal management at this time, plan dosed ibuprofen   - ibuprofen  (ADVIL ) 800 MG tablet; Take 1 tablet (800 mg total) by mouth every 8 (eight)  hours as needed.  Dispense: 30 tablet; Refill: 1  3. Anxiety and depression Elevated screening today, reports sees counselor  Labs/procedures today:   Mammogram: @ 22yo, or sooner if problems Colonoscopy: @ 22yo, or sooner if problems  No orders of the defined types were placed in this encounter.   Meds:  Meds ordered this encounter  Medications   ibuprofen  (ADVIL ) 800 MG tablet    Sig: Take 1 tablet (800 mg total) by mouth every 8 (eight) hours as needed.    Dispense:  30 tablet    Refill:  1  =   Gabriela Daring, FNP

## 2024-01-20 NOTE — Progress Notes (Signed)
 Pt presents for AEX Last PAP 12/2022 Declines STD testing and BC  Pt reports irregular Pt c/o bumps in the vaginal area  PHQ 9 and GAD 7 elevated. Pt seeing therapist

## 2024-03-11 ENCOUNTER — Other Ambulatory Visit: Payer: Self-pay

## 2024-03-11 MED ORDER — TRETINOIN 0.025 % EX CREA: TOPICAL_CREAM | Freq: Every day | CUTANEOUS | 2 refills | Status: AC

## 2024-03-11 NOTE — Progress Notes (Signed)
 Pt was seen in dec and didn't need tretinoin  at that point but now does so I sent refill
